# Patient Record
Sex: Female | Born: 2000 | Race: White | Hispanic: Yes | Marital: Single | Smoking: Never smoker
Health system: Southern US, Community
[De-identification: ages and names within clinical notes are randomized; demographics above are authoritative.]

## PROBLEM LIST (undated history)

## (undated) ENCOUNTER — Inpatient Hospital Stay (HOSPITAL_COMMUNITY): Payer: Self-pay

## (undated) DIAGNOSIS — E282 Polycystic ovarian syndrome: Secondary | ICD-10-CM

## (undated) DIAGNOSIS — R768 Other specified abnormal immunological findings in serum: Secondary | ICD-10-CM

## (undated) DIAGNOSIS — R7689 Other specified abnormal immunological findings in serum: Secondary | ICD-10-CM

## (undated) DIAGNOSIS — R569 Unspecified convulsions: Secondary | ICD-10-CM

## (undated) HISTORY — DX: Polycystic ovarian syndrome: E28.2

## (undated) HISTORY — PX: NO PAST SURGERIES: SHX2092

## (undated) HISTORY — DX: Other specified abnormal immunological findings in serum: R76.8

## (undated) HISTORY — DX: Unspecified convulsions: R56.9

## (undated) HISTORY — DX: Other specified abnormal immunological findings in serum: R76.89

---
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
## 2022-04-12 ENCOUNTER — Encounter (HOSPITAL_COMMUNITY): Payer: Self-pay

## 2022-04-12 ENCOUNTER — Ambulatory Visit (HOSPITAL_COMMUNITY)
Admission: EM | Admit: 2022-04-12 | Discharge: 2022-04-12 | Disposition: A | Discharge status: Home / Self Care | Payer: Medicaid Other | Attending: Family Medicine | Admitting: Family Medicine

## 2022-04-12 DIAGNOSIS — L600 Ingrowing nail: Secondary | ICD-10-CM | POA: Diagnosis not present

## 2022-04-12 DIAGNOSIS — M79675 Pain in left toe(s): Secondary | ICD-10-CM | POA: Diagnosis not present

## 2022-04-12 MED ORDER — IBUPROFEN 800 MG PO TABS: 800.0000 mg | ORAL_TABLET | Freq: Three times a day (TID) | ORAL | 0 refills | Status: DC

## 2022-04-12 MED ORDER — DOXYCYCLINE HYCLATE 100 MG PO CAPS: 100.0000 mg | ORAL_CAPSULE | Freq: Two times a day (BID) | ORAL | 0 refills | Status: DC

## 2022-04-12 NOTE — ED Provider Notes (Signed)
  [facility]   [id number] 04/12/22 Arrival Time: 1636  ASSESSMENT & PLAN:  1. Great toe pain, left   2. Ingrown toenail of left foot    Podiatry referral placed.  Discharge Medication List as of 04/12/2022  7:11 PM     START taking these medications   Details  doxycycline (VIBRAMYCIN) 100 MG capsule Take 1 capsule (100 mg total) by mouth 2 (two) times daily., Starting Thu 04/12/2022, Normal    ibuprofen (ADVIL) 800 MG tablet Take 1 tablet (800 mg total) by mouth 3 (three) times daily with meals., Starting Thu 04/12/2022, Normal         Follow-up Information     Schedule an appointment as soon as possible for a visit  with [facility]).   Contact information: [address] [place],  [place]  [address]  [phone]                Reviewed expectations re: course of current medical issues. Questions answered. Outlined signs and symptoms indicating need for more acute intervention. Understanding verbalized. After Visit Summary given.   SUBJECTIVE: History from: Patient. Andrea Jacobson is a 21 y.o. female. Reports: Pt presents to the office for left toe pain. Pt thinks she has ingrown toe nail. Is painful. No drainage or bleeding. Afebrile.  OBJECTIVE:  Vitals:   04/12/22 1902  BP: (!) 125/93  Pulse: 85  Resp: 18  Temp: 98.5 F (36.9 C)  TempSrc: Oral  SpO2: 99%    General appearance: alert; no distress Skin: redness/erythema and mild swelling of nailfold around lateral LEFT great toe; tender to touch Psychological: alert and cooperative; normal mood and affect   No Known Allergies  History reviewed. No pertinent past medical history. Social History   Socioeconomic History   Marital status: Single    Spouse name: Not on file   Number of children: Not on file   Years of education: Not on file   Highest education level: Not on file  Occupational History   Not on file  Tobacco Use   Smoking status: Never    Smokeless tobacco: Never  Substance and Sexual Activity   Alcohol use: Never   Drug use: Never   Sexual activity: Yes    Partners: Male    Birth control/protection: Condom, Pill  Other Topics Concern   Not on file  Social History Narrative   Mother of pt states pt lives with stepdad, stepmom and two babies   Social Determinants of Health   Financial Resource Strain: Not on file  Food Insecurity: Not on file  Transportation Needs: Not on file  Physical Activity: Not on file  Stress: Not on file  Social Connections: Not on file  Intimate Partner Violence: Not on file   History reviewed. No pertinent family history. History reviewed. No pertinent surgical history.   Mardella Layman, MD 04/12/22 [phone]

## 2022-04-12 NOTE — ED Triage Notes (Signed)
Pt presents to the office for left toe pain. Pt thinks she has ingrown toe nail.

## 2022-04-24 ENCOUNTER — Encounter (HOSPITAL_COMMUNITY): Payer: Self-pay

## 2022-04-24 ENCOUNTER — Emergency Department (HOSPITAL_COMMUNITY): Payer: Medicaid Other

## 2022-04-24 ENCOUNTER — Ambulatory Visit (INDEPENDENT_AMBULATORY_CARE_PROVIDER_SITE_OTHER): Payer: Medicaid Other | Admitting: Podiatry

## 2022-04-24 ENCOUNTER — Emergency Department (HOSPITAL_COMMUNITY)
Admission: EM | Admit: 2022-04-24 | Discharge: 2022-04-24 | Disposition: A | Discharge status: Home / Self Care | Payer: Medicaid Other | Attending: Emergency Medicine | Admitting: Emergency Medicine

## 2022-04-24 DIAGNOSIS — L600 Ingrowing nail: Secondary | ICD-10-CM | POA: Diagnosis not present

## 2022-04-24 DIAGNOSIS — R55 Syncope and collapse: Secondary | ICD-10-CM | POA: Diagnosis not present

## 2022-04-24 DIAGNOSIS — R42 Dizziness and giddiness: Secondary | ICD-10-CM | POA: Diagnosis present

## 2022-04-24 DIAGNOSIS — I959 Hypotension, unspecified: Secondary | ICD-10-CM | POA: Diagnosis not present

## 2022-04-24 LAB — URINALYSIS, ROUTINE W REFLEX MICROSCOPIC
Bilirubin Urine: NEGATIVE
Glucose, UA: NEGATIVE mg/dL
Hgb urine dipstick: NEGATIVE
Ketones, ur: NEGATIVE mg/dL
Nitrite: NEGATIVE
Protein, ur: NEGATIVE mg/dL
Specific Gravity, Urine: 1.024 (ref 1.005–1.030)
pH: 7 (ref 5.0–8.0)

## 2022-04-24 LAB — I-STAT BETA HCG BLOOD, ED (MC, WL, AP ONLY): I-stat hCG, quantitative: <5 m[IU]/mL (ref <5)

## 2022-04-24 LAB — CBC
HCT: 40 % (ref 36.0–46.0)
Hemoglobin: 13.1 g/dL (ref 12.0–15.0)
MCH: 28.5 pg (ref 26.0–34.0)
MCHC: 32.8 g/dL (ref 30.0–36.0)
MCV: 87.1 fL (ref 80.0–100.0)
Platelets: 214 10*3/uL (ref 150–400)
RBC: 4.59 MIL/uL (ref 3.87–5.11)
RDW: 12.7 % (ref 11.5–15.5)
WBC: 11 10*3/uL — ABNORMAL HIGH (ref 4.0–10.5)
nRBC: 0 % (ref 0.0–0.2)

## 2022-04-24 LAB — BASIC METABOLIC PANEL
Anion gap: 6 (ref 5–15)
BUN: 17 mg/dL (ref 6–20)
CO2: 21 mmol/L — ABNORMAL LOW (ref 22–32)
Calcium: 8.1 mg/dL — ABNORMAL LOW (ref 8.9–10.3)
Chloride: 112 mmol/L — ABNORMAL HIGH (ref 98–111)
Creatinine, Ser: 0.61 mg/dL (ref 0.44–1.00)
GFR, Estimated: >60 mL/min (ref >60)
Glucose, Bld: 83 mg/dL (ref 70–99)
Potassium: 2.9 mmol/L — ABNORMAL LOW (ref 3.5–5.1)
Sodium: 139 mmol/L (ref 135–145)

## 2022-04-24 LAB — CBG MONITORING, ED: Glucose-Capillary: 101 mg/dL — ABNORMAL HIGH (ref 70–99)

## 2022-04-24 MED ORDER — POTASSIUM CHLORIDE CRYS ER 20 MEQ PO TBCR: 60.0000 meq | EXTENDED_RELEASE_TABLET | Freq: Once | ORAL | Status: DC

## 2022-04-24 MED ORDER — SODIUM CHLORIDE 0.9 % IV BOLUS: 500.0000 mL | Freq: Once | INTRAVENOUS | Status: DC

## 2022-04-24 MED ORDER — CEFADROXIL 500 MG PO CAPS: 500.0000 mg | ORAL_CAPSULE | Freq: Two times a day (BID) | ORAL | 0 refills | Status: DC

## 2022-04-24 MED ORDER — NEOMYCIN-POLYMYXIN-HC 3.5-10000-1 OT SUSP: OTIC | 0 refills | Status: DC

## 2022-04-24 NOTE — ED Triage Notes (Signed)
Pt had a ingrown toenail procedure then passed out x 2. 110/66-60-100% RA CBG 149  # 18 L ac with 500 ns

## 2022-04-24 NOTE — Discharge Instructions (Addendum)
We evaluated you for your fainting.  Your lab tests and EKG were reassuring.  Your symptoms are likely due to a combination of dehydration and your toenail procedure.  Please drink a lot of fluids today and try to stay hydrated.  Please follow-up closely with your primary physician.  Please return to the emergency department if you develop any episodes without feeling dizzy beforehand, chest pain or shortness of breath, sudden onset of symptoms, severe headaches, abdominal pain, palpitations, or any other concerning symptoms.

## 2022-04-24 NOTE — Patient Instructions (Signed)

## 2022-04-24 NOTE — ED Provider Notes (Signed)
[facility] DEPT Provider Note  CSN: [id number] Arrival date & time: 04/24/22 1045  Chief Complaint(s) Near Syncope  HPI Andrea Jacobson is a 21 y.o. female without significant past medical history presenting to the emergency department with syncope.  Patient reports that she was having a toenail removed today, after the procedure she stood up to walk and felt dizzy, lightheaded, nauseous, and then lost consciousness.  She reports that she did not hit her head in the fall or have any pain since the fall.  She sat down at the clinic and it happened 1 more time so they advised her to come to the emergency department.  Patient reports she currently feels fine.  She denies any chest pain, palpitations, shortness of breath, nausea, vomiting, abdominal pain.  She denies any headaches.  Denies vision changes or neck stiffness.  She reports she has had nothing to eat or drink at all today.  Past Medical History History reviewed. No pertinent past medical history. 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 Authorizing Provider  cefadroxil (DURICEF) 500 MG capsule Take 1 capsule (500 mg total) by mouth 2 (two) times daily. 04/24/22   McDonald, Stephan Minister, DPM  doxycycline (VIBRAMYCIN) 100 MG capsule Take 1 capsule (100 mg total) by mouth 2 (two) times daily. 04/12/22   Vanessa Kick, MD  ibuprofen (ADVIL) 800 MG tablet Take 1 tablet (800 mg total) by mouth 3 (three) times daily with meals. 04/12/22   Vanessa Kick, MD  lidocaine (XYLOCAINE) 2 % jelly Apply 1 Application topically 2 (two) times daily. 11/03/21    Henson, Vickie L, NP-C  metroNIDAZOLE (FLAGYL) 500 MG tablet Take 1 tablet (500 mg total) by mouth 2 (two) times daily. 11/10/21   Henson, Vickie L, NP-C  neomycin-polymyxin-hydrocortisone (CORTISPORIN) 3.5-10000-1 OTIC suspension Apply 1-2 drops daily after soaking and cover with bandaid 04/24/22   McDonald, Stephan Minister, DPM  valACYclovir (VALTREX) 500 MG tablet TAKE 1 TABLET(500 MG) BY MOUTH EVERY 12 HOURS AT ONSET OF SYMPTOMS FOR 3 DAYS 12/14/21   Horald Pollen, MD                                                                                                                                    Past Surgical History History reviewed. No pertinent surgical history. Family History History reviewed. No pertinent family history.  Social History Social History   Tobacco Use   Smoking status: Never   Smokeless tobacco: Never  Substance Use Topics   Alcohol use: Never   Drug use: Never   Allergies Patient  has no known allergies.  Review of Systems Review of Systems  All other systems reviewed and are negative.   Physical Exam Vital Signs  I have reviewed the triage vital signs BP 105/68   Pulse 87   Temp 98.5 F (36.9 C) (Oral)   Resp 17   LMP 02/22/2022 (Approximate)   SpO2 94%  Physical Exam Vitals and nursing note reviewed.  Constitutional:      General: She is not in acute distress.    Appearance: She is well-developed.  HENT:     Head: Normocephalic and atraumatic.     Mouth/Throat:     Mouth: Mucous membranes are moist.  Eyes:     Pupils: Pupils are equal, round, and reactive to light.  Cardiovascular:     Rate and Rhythm: Normal rate and regular rhythm.     Heart sounds: No murmur heard. Pulmonary:     Effort: Pulmonary effort is normal. No respiratory distress.     Breath sounds: Normal breath sounds.  Abdominal:     General: Abdomen is flat.     Palpations: Abdomen is soft.     Tenderness: There is no abdominal tenderness.  Musculoskeletal:         General: No tenderness.     Right lower leg: No edema.     Left lower leg: No edema.  Skin:    General: Skin is warm and dry.  Neurological:     General: No focal deficit present.     Mental Status: She is alert. Mental status is at baseline.  Psychiatric:        Mood and Affect: Mood normal.        Behavior: Behavior normal.     ED Results and Treatments Labs (all labs ordered are listed, but only abnormal results are displayed) Labs Reviewed  BASIC METABOLIC PANEL - Abnormal; Notable for the following components:      Result Value   Potassium 2.9 (*)    Chloride 112 (*)    CO2 21 (*)    Calcium 8.1 (*)    All other components within normal limits  CBC - Abnormal; Notable for the following components:   WBC 11.0 (*)    All other components within normal limits  URINALYSIS, ROUTINE W REFLEX MICROSCOPIC - Abnormal; Notable for the following components:   APPearance HAZY (*)    Leukocytes,Ua TRACE (*)    Bacteria, UA FEW (*)    All other components within normal limits  CBG MONITORING, ED - Abnormal; Notable for the following components:   Glucose-Capillary 101 (*)    All other components within normal limits  CBG MONITORING, ED  I-STAT BETA HCG BLOOD, ED ([facility], [facility], [facility] ONLY)                                                                                                                          Radiology DG Chest 2 View  Result Date: 04/24/2022 CLINICAL DATA:  Provided  history: Syncope. EXAM: CHEST - 2 VIEW COMPARISON:  Prior chest radiographs 10/11/2006 and earlier. FINDINGS: Heart size within normal limits. No appreciable airspace consolidation. No evidence of pleural effusion or pneumothorax. No acute bony abnormality identified. 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 IMPRESSION: No evidence of active cardiopulmonary disease. 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 Electronically Signed   By: Jackey Loge D.O.   On: 04/24/2022 12:59    Pertinent  labs & imaging results that were available during my care of the patient were reviewed by me and considered in my medical decision making (see MDM for details).  Medications Ordered in ED Medications  sodium chloride 0.9 % bolus 500 mL (has no administration in time range)  potassium chloride SA (KLOR-CON M) CR tablet 60 mEq (has no administration in time range)                                                                                                                                     Procedures .1-3 Lead EKG Interpretation  Performed by: Lonell Grandchild, MD Authorized by: Lonell Grandchild, MD     Interpretation: normal     ECG rate:  86   ECG rate assessment: normal     Rhythm: sinus rhythm     Ectopy: none     Conduction: normal     (including critical care time)  Medical Decision Making / ED Course   MDM:  21 year old female resenting to the emergency department with syncope.  Suspect combination of vasovagal syncope and dehydration/orthostatic syncope.  Patient well-appearing.  Vital signs are reassuring and her exam is unremarkable.  Doubt cardiogenic cause of syncope, no evidence for proarrhythmic condition such as WPW, HOC UM, ARVD, Brugada, long QT syndrome.  She does have low potassium which is probably not related but we will replete. Doubt PE, PERC rule negative. Doubt neurogenic cause of syncope, no witnessed seizure activity or postictal period.  Patient able to stand in the room without recurrence of symptoms.  Patient also with negative pregnancy test, No abdominal pain to suggest ectopic pregnancy.  Given reassuring workup, will discharge with close primary physician follow-up. Will discharge patient to home. All questions answered. Patient comfortable with plan of discharge. Return precautions discussed with patient and specified on the after visit summary.    Additional history obtained: -Additional history obtained from family -External records  from outside source obtained and reviewed including: Chart review including previous notes, labs, imaging, consultation notes including UC visit 04/12/22   Lab Tests: -I ordered, reviewed, and interpreted labs.   The pertinent results include:   Labs Reviewed  BASIC METABOLIC PANEL - Abnormal; Notable for the following components:      Result Value   Potassium 2.9 (*)    Chloride 112 (*)    CO2 21 (*)    Calcium 8.1 (*)    All other components within normal limits  CBC -  Abnormal; Notable for the following components:   WBC 11.0 (*)    All other components within normal limits  URINALYSIS, ROUTINE W REFLEX MICROSCOPIC - Abnormal; Notable for the following components:   APPearance HAZY (*)    Leukocytes,Ua TRACE (*)    Bacteria, UA FEW (*)    All other components within normal limits  CBG MONITORING, ED - Abnormal; Notable for the following components:   Glucose-Capillary 101 (*)    All other components within normal limits  CBG MONITORING, ED  I-STAT BETA HCG BLOOD, ED ([facility], [facility], [facility] ONLY)    Notable for negative pregnancy, mild hypokalemia, mild leukocytosis likely reactive, UA with bacturia without symptoms of UTI  EKG   EKG Interpretation  Date/Time:  Tuesday April 24 2022 11:08:10 EST Ventricular Rate:  85 PR Interval:  125 QRS Duration: 90 QT Interval:  353 QTC Calculation: 420 R Axis:   75 Text Interpretation: Sinus rhythm Confirmed by Garnette Gunner [phone]) on 04/24/2022 2:32:26 PM         Imaging Studies ordered: I ordered imaging studies including CXR On my interpretation imaging demonstrates no acute process I independently visualized and interpreted imaging. I agree with the radiologist interpretation   Medicines ordered and prescription drug management: Meds ordered this encounter  Medications   sodium chloride 0.9 % bolus 500 mL   potassium chloride SA (KLOR-CON M) CR tablet 60 mEq    -I have reviewed the patients home medicines and have made  adjustments as needed   Cardiac Monitoring: The patient was maintained on a cardiac monitor.  I personally viewed and interpreted the cardiac monitored which showed an underlying rhythm of: NSR  Reevaluation: After the interventions noted above, I reevaluated the patient and found that they have improved  Co morbidities that complicate the patient evaluation History reviewed. No pertinent past medical history.    Dispostion: Disposition decision including need for hospitalization was considered, and patient discharged from emergency department.    Final Clinical Impression(s) / ED Diagnoses Final diagnoses:  Syncope and collapse     This chart was dictated using voice recognition software.  Despite best efforts to proofread,  errors can occur which can change the documentation meaning.    Cristie Hem, MD 04/24/22 1537

## 2022-04-24 NOTE — ED Provider Triage Note (Signed)
Emergency Medicine Provider Triage Evaluation Note  Andrea Jacobson , a 21 y.o. female  was evaluated in triage.  Syncopal event occurred around 9 AM this morning.  Patient was at her podiatrist office having an ingrown toenail removed after she got up from the procedure she began to feel nauseous.  She reports that while walking to the checkout counter she felt lightheaded and passed out briefly she reports the registration person was able to catch her before she fell to the ground.  She denies any injury.  Review of Systems  Positive: Lightheadedness, syncope, nausea Negative: Chest pain, shortness of breath, extremity swelling/color change, abdominal pain, injury  Physical Exam  BP 101/66 (BP Location: Right Arm)   Pulse 69   Temp 98.7 F (37.1 C) (Oral)   Resp (!) 23   SpO2 100%  Gen:   Awake, no distress   Resp:  Normal effort  MSK:   Moves extremities without difficulty  Other:  Heart regular rate and rhythm, lungs clear  Medical Decision Making  Medically screening exam initiated at 11:38 AM.  Appropriate orders placed.  Andrea Jacobson was informed that the remainder of the evaluation will be completed by another provider, this initial triage assessment does not replace that evaluation, and the importance of remaining in the ED until their evaluation is complete.     Note: Portions of this report may have been transcribed using voice recognition software. Every effort was made to ensure accuracy; however, inadvertent computerized transcription errors may still be present.    Andrea Salinas, PA-C 04/24/22 1139

## 2022-04-25 NOTE — Progress Notes (Signed)
  Subjective:  Patient ID: Andrea Jacobson, female    DOB: 2000-10-22,  MRN: [id number]  Chief Complaint  Patient presents with   Ingrown Toenail    Left great toe    21 y.o. female presents with the above complaint. History confirmed with patient.   Objective:  Physical Exam: warm, good capillary refill, no trophic changes or ulcerative lesions, normal DP and PT pulses, normal sensory exam, and ingrown left hallux medial border. Assessment:   1. Ingrown left greater toenail [L60.0]      Plan:  Patient was evaluated and treated and all questions answered.    Ingrown Nail, left -Patient elects to proceed with minor surgery to remove ingrown toenail today. Consent reviewed and signed by patient. -Ingrown nail excised. See procedure note. -Educated on post-procedure care including soaking. Written instructions provided and reviewed. -Rx for Cortisporin sent to pharmacy. -Advised on signs and symptoms of infection developing.  We discussed that the phenol likely will create some redness and edema and tenderness around the nailbed as long as it is localized this is to be expected.  Will return as needed if any infection signs develop  Procedure: Excision of Ingrown Toenail Location: Left 1st toe medial nail borders. Anesthesia: Lidocaine 1% plain; 1.5 mL and Marcaine 0.5% plain; 1.5 mL, digital block. Skin Prep: Betadine. Dressing: Silvadene; telfa; dry, sterile, compression dressing. Technique: Following skin prep, the toe was exsanguinated and a tourniquet was secured at the base of the toe. The affected nail border was freed, split with a nail splitter, and excised. Chemical matrixectomy was then performed with phenol and irrigated out with alcohol. The tourniquet was then removed and sterile dressing applied. Disposition: Patient tolerated procedure well.    Following the procedure the patient comfortably ambulated to the checkout area and then began to experience a syncopal  vasovagal type response.  She was seated and given water and crackers and was found to be hypotensive.  After letting her rest for some time she stood up this happened again and she was even further hypotensive and slightly tachycardic.  Oxygen saturation remained normal throughout.  She was brought back into the exam room and placed in the Trendelenburg position and EMS was called to transport her to the emergency room   No follow-ups on file.

## 2022-05-15 ENCOUNTER — Ambulatory Visit (INDEPENDENT_AMBULATORY_CARE_PROVIDER_SITE_OTHER): Payer: Medicaid Other | Admitting: Internal Medicine

## 2022-05-15 ENCOUNTER — Encounter: Payer: Self-pay | Admitting: Internal Medicine

## 2022-05-15 VITALS — BP 120/70 | HR 64 | Ht 61.0 in | Wt 154.0 lb

## 2022-05-15 DIAGNOSIS — L680 Hirsutism: Secondary | ICD-10-CM | POA: Diagnosis not present

## 2022-05-15 DIAGNOSIS — N911 Secondary amenorrhea: Secondary | ICD-10-CM | POA: Diagnosis not present

## 2022-05-15 DIAGNOSIS — E282 Polycystic ovarian syndrome: Secondary | ICD-10-CM | POA: Diagnosis not present

## 2022-05-15 LAB — COMPREHENSIVE METABOLIC PANEL
ALT: 47 U/L — ABNORMAL HIGH (ref 0–35)
AST: 27 U/L (ref 0–37)
Albumin: 4.6 g/dL (ref 3.5–5.2)
Alkaline Phosphatase: 84 U/L (ref 47–119)
BUN: 15 mg/dL (ref 6–23)
CO2: 26 mEq/L (ref 19–32)
Calcium: 9.7 mg/dL (ref 8.4–10.5)
Chloride: 105 mEq/L (ref 96–112)
Creatinine, Ser: 0.71 mg/dL (ref 0.40–1.20)
GFR: 123.4 mL/min (ref >60.00)
Glucose, Bld: 104 mg/dL — ABNORMAL HIGH (ref 70–99)
Potassium: 3.9 mEq/L (ref 3.5–5.1)
Sodium: 141 mEq/L (ref 135–145)
Total Bilirubin: 0.4 mg/dL (ref 0.2–1.2)
Total Protein: 7.7 g/dL (ref 6.0–8.3)

## 2022-05-15 LAB — HEMOGLOBIN A1C: Hgb A1c MFr Bld: 5.4 % (ref 4.6–6.5)

## 2022-05-15 LAB — LIPID PANEL
Cholesterol: 170 mg/dL (ref 0–200)
HDL: 52.7 mg/dL (ref >39.00)
LDL Cholesterol: 99 mg/dL (ref 0–99)
NonHDL: 117.3
Total CHOL/HDL Ratio: 3
Triglycerides: 91 mg/dL (ref 0.0–149.0)
VLDL: 18.2 mg/dL (ref 0.0–40.0)

## 2022-05-15 LAB — LUTEINIZING HORMONE: LH: 24 m[IU]/mL

## 2022-05-15 LAB — FOLLICLE STIMULATING HORMONE: FSH: 7.4 m[IU]/mL

## 2022-05-15 LAB — TSH: TSH: 1.12 u[IU]/mL (ref 0.40–5.00)

## 2022-05-15 LAB — CORTISOL: Cortisol, Plasma: 8.6 ug/dL

## 2022-05-15 LAB — T4, FREE: Free T4: 0.79 ng/dL (ref 0.60–1.60)

## 2022-05-15 NOTE — Progress Notes (Unsigned)
Name: Andrea Jacobson  MRN/ DOB: [id number], 10/17/2000    Age/ Sex: 22 y.o., female    PCP: Horald Pollen, MD   Reason for Endocrinology Evaluation: PCOS     Date of Initial Endocrinology Evaluation: 05/15/2022     HPI: Andrea Jacobson is a 22 y.o. female with a past medical history of PCOS. The patient presented for initial endocrinology clinic visit on 05/15/2022 for consultative assistance with her PCOS.   Patient presented to PCP in February 2023 for evaluation of hirsutism and concerns regarding PCOS  Historically she has had normal testosterone level at 26.5 NG/DL in February 2023, these results were confirmed again in March 2023.  She has had normal A1c at 5.4% 06/2021 and normal TFTs  Patient presented to the ED on 04/24/2022 with near syncope, she was noted with low potassium of 2.9 mmol/L, low calcium at 8.1 mg/dL    The patient has menstrual irregularities such as oligomenorrhea or amenorrhea. She started menarche around age 27 y.o.   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 She does not  have family history of PCOS, or, ovarian cancer.    No plans of conception  Last pelvic exam in the past few months    She was on OCP's from 2019 until 2022. She did not like the effects of it.    She did not recall last LMP but believes it may have been in 01/2022     HISTORY:  Past Medical History: No past medical history on file. Past Surgical History: No past surgical history on file.  Social History:  reports that she has never smoked. She has never used smokeless tobacco. She reports that she does not drink alcohol and does not use drugs. Family  History: family history is not on file.   HOME MEDICATIONS: Allergies as of 05/15/2022   No Known Allergies      Medication List        Accurate as of May 15, 2022  7:20 AM. If you have any questions, ask your nurse or doctor.          cefadroxil 500 MG capsule Commonly known as: DURICEF Take 1 capsule (500 mg total) by mouth 2 (two) times daily.   doxycycline 100 MG capsule Commonly known as: VIBRAMYCIN Take 1 capsule (100 mg total) by mouth 2 (two) times daily.   ibuprofen 800 MG tablet Commonly known as: ADVIL Take 1 tablet (800 mg total) by mouth 3 (three) times daily with meals.   lidocaine 2 % jelly Commonly known as: XYLOCAINE Apply 1 Application topically 2 (two) times daily.   metroNIDAZOLE 500 MG tablet Commonly known as: FLAGYL Take 1 tablet (500 mg total) by mouth 2 (two) times daily.   neomycin-polymyxin-hydrocortisone 3.5-10000-1 OTIC suspension Commonly known as: CORTISPORIN Apply 1-2 drops daily after soaking and cover with bandaid   valACYclovir 500 MG tablet Commonly known as: VALTREX TAKE 1 TABLET(500 MG) BY MOUTH EVERY 12 HOURS AT ONSET OF SYMPTOMS FOR 3 DAYS          REVIEW OF SYSTEMS: A comprehensive ROS was conducted with the patient and is negative except as per HPI  OBJECTIVE:  VS: BP 120/70 (BP Location: Left Arm, Patient Position: Sitting, Cuff Size: Small)   Pulse 64   Ht 5\' 1"  (1.549 m)   Wt 154 lb (69.9 kg)   LMP 02/22/2022 (Approximate)   SpO2 99%   BMI 29.10 kg/m    Wt Readings from Last 3 Encounters:  11/03/21 144 lb (65.3 kg) (77 %, Z= 0.75)*  07/03/21 144 lb 4 oz (65.4 kg) (79 %, Z= 0.79)*  11/24/20 147 lb 14.9 oz (67.1 kg) (83 %, Z= 0.96)*   * Growth percentiles are based on CDC (Girls, 2-20 Years) data.     EXAM: General: Pt appears well and is in NAD Stubbles noted at the side of face and chin areas  Eyes: External eye exam normal without stare, lid lag or exophthalmos.  EOM intact.  PERRL.  Neck:  General: Supple without adenopathy. Thyroid: Thyroid size normal.  No goiter or nodules appreciated. No thyroid bruit.  Lungs: Clear with good BS bilat with no rales, rhonchi, or wheezes  Heart: Auscultation: RRR.  Abdomen: Normoactive bowel sounds, soft, nontender, without masses or organomegaly palpable  Extremities:  BL LE: No pretibial edema normal ROM and strength.  Mental Status: Judgment, insight: Intact Orientation: Oriented to time, place, and person Mood and affect: No depression, anxiety, or agitation     DATA REVIEWED:     Latest Reference Range & Units 05/15/22 13:18  Sodium 135 - 145 mEq/L 141  Potassium 3.5 - 5.1 mEq/L 3.9  Chloride 96 - 112 mEq/L 105  CO2 19 - 32 mEq/L 26  Glucose 70 - 99 mg/dL 07/14/22 (H)  BUN 6 - 23 mg/dL 15  Creatinine 176 - 1.60 mg/dL 7.37  Calcium 8.4 - 1.06 mg/dL 9.7  Alkaline Phosphatase 47 - 119 U/L 84  Albumin 3.5 - 5.2 g/dL 4.6  AST 0 - 37 U/L 27  ALT 0 - 35 U/L 47 (H)  Total Protein 6.0 - 8.3 g/dL 7.7  Total Bilirubin 0.2 - 1.2 mg/dL 0.4  GFR 26.9 mL/min 123.40  Total CHOL/HDL Ratio  3  Cholesterol 0 - 200 mg/dL >48.54  HDL Cholesterol 627 mg/dL >03.50  LDL (calc) 0 - 99 mg/dL 99  NonHDL  09.38  Triglycerides 0.0 - 149.0 mg/dL 182.99  VLDL 0.0 - 37.1 mg/dL 69.6    Latest Reference Range & Units 05/15/22 13:18  DHEA-SO4 44 - 286 mcg/dL 07/14/22  Cortisol, Plasma ug/dL 8.6  LH mIU/mL 381  FSH mIU/ML 7.4  Prolactin ng/mL 7.0  Glucose 70 - 99 mg/dL 01.75 (H)  Hemoglobin 102 4.6 - 6.5 % 5.4  Estradiol pg/mL 32  TSH 0.40 - 5.00 uIU/mL 1.12  T4,Free(Direct) 0.60 - 1.60 ng/dL H8N     Pelvic ultrasound 07/05/2021 Narrative & Impression  CLINICAL DATA:  Evaluate ovaries, history of PCOS.  NO TRANSVAGINAL   EXAM: TRANSABDOMINAL ULTRASOUND OF PELVIS   TECHNIQUE: Transabdominal ultrasound examination of the pelvis was performed including evaluation of the uterus, ovaries, adnexal regions, and pelvic cul-de-sac.   COMPARISON:   None.   FINDINGS: Uterus   Measurements: 7.2 x 4.3 x 5.7 cm = volume: 94 mL. No fibroids or other mass visualized.   Endometrium   Thickness: 9 mm.  No focal abnormality visualized.   Right ovary   Not visualized transabdominally secondary to shadowing bowel gas.   Left ovary   Not visualized transabdominally secondary to shadowing bowel gas.   Other findings:  No abnormal free fluid.   IMPRESSION: Ovaries are not visualized  transabdominally secondary to shadowing bowel gas.      ASSESSMENT/PLAN/RECOMMENDATIONS:   Secondary Amenorrhea :  -High suspicion for polycystic ovary syndrome - She would like to avoid COC's due to skepticism about side effects  - She is interested in progesterone use  -We discussed increased risk of uterine cancer with unopposed estrogen effect -Labs show normal TFTs, DHEA-S and A1c as well as lipid  Medications : Start progesterone 200 mg, 2 tabs on days 1 through 10 of each month  2. Hirsutism   -Patient is not interested in combined COC's which is the first-line of treatment for hirsutism -Unable to proceed with spironolactone as long as she is not on COC's due to teratogenicity -DHEA-S and cortisol are normal -ACTH and testosterone pending, as well as 17 hydroxyprogesterone   Follow-up in 6 months Signed electronically by: Lyndle Herrlich, MD  [facility] End[facility]  [facility] [address] [place] [place], [place] [address] Phone: [phone] FAX: [phone]   CC: Georgina Quint, MD [address] [place] [address] Phone: [phone] Fax: [phone]   Return to Endocrinology clinic as below: Future Appointments  Date Time Provider Department Center  05/15/2022  1:00 PM Epifanio Labrador, Konrad Dolores, MD [facility] None

## 2022-05-15 NOTE — Patient Instructions (Signed)
      SALIVARY CORTISOL COLLECTION INSTRUCTIONS    Precautions:  Please collect sample at Midnight . You will need to do this on 2 nights  No food or fluids 30 minutes prior to collection.  Do not use any creams, lotions on hands, or use steroid inhalers 24- hours prior to collection.  Wash hands carefully.  Avoid any activity that could cause your gums to bleed: including flushing of brushing your teeth.  Kit must not be used in children less than 3 years of age, or a person that is at risk for choking on collection kit.  Instructions for saliva collection:   Rinse mouth thoroughly with water and discard. Do not swallow.  Hold the Salivette at the rim of the suspended insert and remove the stopper.  Remove the swab.  Place swab under tongue until well saturated, approximately 1 minute.   Return the saturated swab to the suspended insert and close the Salivette firmly with the stopper.  Do not remove the tube holding the insert. The Salivette should be sent to the lab with the swab.   Come to the lab and leave the Salivette kit for labeling with your identifying information.   Make sure you refrigerate sample if not bringing to the lab immediately. Try to use cold packs for transportation if available.     

## 2022-05-17 ENCOUNTER — Other Ambulatory Visit: Payer: Medicaid Other

## 2022-05-17 DIAGNOSIS — L680 Hirsutism: Secondary | ICD-10-CM | POA: Diagnosis not present

## 2022-05-17 MED ORDER — PROGESTERONE 200 MG PO CAPS: 400.0000 mg | ORAL_CAPSULE | ORAL | 6 refills | Status: DC

## 2022-05-19 LAB — TESTOSTERONE, TOTAL, LC/MS/MS: Testosterone, Total, LC-MS-MS: 37 ng/dL (ref 2–45)

## 2022-05-19 LAB — ESTRADIOL: Estradiol: 32 pg/mL

## 2022-05-19 LAB — ACTH: C206 ACTH: 12 pg/mL (ref 6–50)

## 2022-05-19 LAB — 17-HYDROXYPROGESTERONE: 17-OH-Progesterone, LC/MS/MS: 42 ng/dL

## 2022-05-19 LAB — PROLACTIN: Prolactin: 7 ng/mL

## 2022-05-19 LAB — DHEA-SULFATE: DHEA-SO4: 223 ug/dL (ref 44–286)

## 2022-05-25 LAB — SALIVARY CORTISOL X2, TIMED
Salivary Cortisol 2nd Specimen: <0.01 ug/dL
Salivary Cortisol Baseline: 0.016 ug/dL

## 2022-08-13 ENCOUNTER — Telehealth: Payer: Self-pay | Admitting: Emergency Medicine

## 2022-08-13 NOTE — Telephone Encounter (Signed)
Prescription Request  08/13/2022  LOV: Visit date not found  What is the name of the medication or equipment?  valACYclovir (VALTREX) 1000 MG tablet  Pt starting to experience out breaks again  Have you contacted your pharmacy to request a refill? No   Which pharmacy would you like this sent to?  [id number] [place], [place] - [address] AT [facility] [address] & [facility] [address] [place] [address] Phone: [phone] Fax: [phone]    Patient notified that their request is being sent to the clinical staff for review and that they should receive a response within 2 business days.   Please advise at Mobile [phone] (mobile)

## 2022-08-13 NOTE — Telephone Encounter (Signed)
Pt need to make appt for evaluation. Haven't seen MD since 06/2021. Pls schedule appt.Marland KitchenRaechel Chute

## 2022-08-14 NOTE — Telephone Encounter (Signed)
Scheduled patient for OV on 08/21/22.

## 2022-08-21 ENCOUNTER — Ambulatory Visit (INDEPENDENT_AMBULATORY_CARE_PROVIDER_SITE_OTHER): Payer: Medicaid Other | Admitting: Emergency Medicine

## 2022-08-21 ENCOUNTER — Encounter: Payer: Self-pay | Admitting: Emergency Medicine

## 2022-08-21 VITALS — BP 110/72 | HR 63 | Temp 98.6°F | Ht 61.0 in | Wt 153.4 lb

## 2022-08-21 DIAGNOSIS — E282 Polycystic ovarian syndrome: Secondary | ICD-10-CM

## 2022-08-21 DIAGNOSIS — R55 Syncope and collapse: Secondary | ICD-10-CM | POA: Diagnosis not present

## 2022-08-21 DIAGNOSIS — N911 Secondary amenorrhea: Secondary | ICD-10-CM

## 2022-08-21 HISTORY — DX: Secondary amenorrhea: N91.1

## 2022-08-21 NOTE — Assessment & Plan Note (Signed)
Creating secondary amenorrhea and hirsutism Clinically stable. No treatment at present time

## 2022-08-21 NOTE — Assessment & Plan Note (Signed)
Secondary to PCOS. Recently evaluated by endocrinologist and started on progesterone 400 mg 10 days out of the month

## 2022-08-21 NOTE — Assessment & Plan Note (Signed)
Not a recurrent problem. Completely negative workup last December.  Clinically stable.  No red flag signs or symptoms. No concerns.  No additional workup necessary at present time.

## 2022-08-21 NOTE — Progress Notes (Signed)
Andrea Jacobson 22 y.o.   Chief Complaint  Patient presents with   Loss of Consciousness    Patient states she has been having some syncope spells. She states she passed out 2-3 months ago, no syncope spells recently. Patient has been very lightheaded     HISTORY OF PRESENT ILLNESS: This is a 22 y.o. female here for follow-up of syncopal episode, last 1 last December when she was seen in the emergency department.  Doing well since then. Also has history of polycystic ovary syndrome.  Recently seen by endocrinologist. Recent blood work results reviewed with patient.  All normal. Emergency department office visit note from last December as follows: Medical Decision Making / ED Course     MDM:   22 year old female resenting to the emergency department with syncope.   Suspect combination of vasovagal syncope and dehydration/orthostatic syncope.  Patient well-appearing.  Vital signs are reassuring and her exam is unremarkable.  Doubt cardiogenic cause of syncope, no evidence for proarrhythmic condition such as WPW, HOC UM, ARVD, Brugada, long QT syndrome.  She does have low potassium which is probably not related but we will replete. Doubt PE, PERC rule negative. Doubt neurogenic cause of syncope, no witnessed seizure activity or postictal period.  Patient able to stand in the room without recurrence of symptoms.  Patient also with negative pregnancy test, No abdominal pain to suggest ectopic pregnancy.  Given reassuring workup, will discharge with close primary physician follow-up. Will discharge patient to home. All questions answered. Patient comfortable with plan of discharge. Return precautions discussed with patient and specified on the after visit summary.  Endocrinologist office visit note from last January as follows: ASSESSMENT/PLAN/RECOMMENDATIONS:    Secondary Amenorrhea :   -High suspicion for polycystic ovary syndrome - She would like to avoid COC's due to skepticism about side  effects  - She is interested in progesterone use  -We discussed increased risk of uterine cancer with unopposed estrogen effect -Labs show normal TFTs, DHEA-S and A1c as well as lipid   Medications : Start progesterone 200 mg, 2 tabs on days 1 through 10 of each month   2. Hirsutism    -Patient is not interested in combined COC's which is the first-line of treatment for hirsutism -Unable to proceed with spironolactone as long as she is not on COC's due to teratogenicity -DHEA-S and cortisol are normal -ACTH and testosterone pending, as well as 17 hydroxyprogesterone     Follow-up in 6 months Signed electronically by: Lyndle Herrlich, MD   [facility] Endocrinology  [facility] [address] [place], [place] [address] Phone: [phone] FAX: [phone]  Loss of Consciousness Pertinent negatives include no abdominal pain, chest pain, dizziness, fever, headaches, nausea, palpitations or vomiting.     Prior to Admission medications   Medication Sig Start Date End Date Taking? Authorizing Provider  progesterone (PROMETRIUM) 200 MG capsule Take 2 capsules (400 mg total) by mouth as directed. 2 tabs daily , for 10 days out of the month 05/17/22  Yes Shamleffer, Konrad Dolores, MD    No Known Allergies  Patient Active Problem List   Diagnosis Date Noted   Irregular periods 11/03/2021   PCOS (polycystic ovarian syndrome) 07/03/2021    No past medical history on file.  No past surgical history on file.  Social History   Socioeconomic History   Marital status: Single    Spouse name: Not on file   Number of children: Not on file   Years  of education: Not on file   Highest education level: Not on file  Occupational History   Not on file  Tobacco Use   Smoking status: Never   Smokeless tobacco: Never  Substance and Sexual Activity   Alcohol use: Never   Drug use: Never   Sexual activity: Yes    Partners: Male    Birth  control/protection: Condom, Pill  Other Topics Concern   Not on file  Social History Narrative   Mother of pt states pt lives with stepdad, stepmom and two babies   Social Determinants of Health   Financial Resource Strain: Not on file  Food Insecurity: Not on file  Transportation Needs: Not on file  Physical Activity: Not on file  Stress: Not on file  Social Connections: Not on file  Intimate Partner Violence: Not on file    No family history on file.   Review of Systems  Constitutional: Negative.  Negative for chills and fever.  HENT: Negative.  Negative for congestion and sore throat.   Respiratory: Negative.  Negative for cough and shortness of breath.   Cardiovascular:  Positive for syncope. Negative for chest pain and palpitations.  Gastrointestinal:  Negative for abdominal pain, diarrhea, nausea and vomiting.  Genitourinary: Negative.  Negative for dysuria and hematuria.  Skin: Negative.  Negative for rash.  Neurological:  Negative for dizziness and headaches.  All other systems reviewed and are negative.   Vitals:   08/21/22 1053  BP: 110/72  Pulse: 63  Temp: 98.6 F (37 C)  SpO2: 97%    Physical Exam Vitals reviewed.  Constitutional:      Appearance: Normal appearance.  HENT:     Head: Normocephalic.     Mouth/Throat:     Mouth: Mucous membranes are moist.     Pharynx: Oropharynx is clear.  Eyes:     Extraocular Movements: Extraocular movements intact.     Conjunctiva/sclera: Conjunctivae normal.     Pupils: Pupils are equal, round, and reactive to light.  Cardiovascular:     Rate and Rhythm: Normal rate and regular rhythm.     Pulses: Normal pulses.     Heart sounds: Normal heart sounds.  Pulmonary:     Effort: Pulmonary effort is normal.     Breath sounds: Normal breath sounds.  Musculoskeletal:     Cervical back: No tenderness.  Lymphadenopathy:     Cervical: No cervical adenopathy.  Skin:    General: Skin is warm and dry.     Capillary  Refill: Capillary refill takes less than 2 seconds.  Neurological:     General: No focal deficit present.     Mental Status: She is alert and oriented to person, place, and time.  Psychiatric:        Mood and Affect: Mood normal.        Behavior: Behavior normal.      ASSESSMENT & PLAN: A total of 42 minutes was spent with the patient and counseling/coordination of care regarding preparing for this visit, review of most recent office visit notes, review of most recent endocrinologist office visit note, review of most recent emergency department visit notes, review of most recent blood work results, differential diagnosis of syncope, education on nutrition, review of all medications, prognosis, documentation, and need for follow-up.  Problem List Items Addressed This Visit       Endocrine   PCOS (polycystic ovarian syndrome)    Creating secondary amenorrhea and hirsutism Clinically stable. No treatment at present time  Other   Vasovagal syncope - Primary    Not a recurrent problem. Completely negative workup last December.  Clinically stable.  No red flag signs or symptoms. No concerns.  No additional workup necessary at present time.      Secondary amenorrhea    Secondary to PCOS. Recently evaluated by endocrinologist and started on progesterone 400 mg 10 days out of the month      Patient Instructions  Health Maintenance, Female Adopting a healthy lifestyle and getting preventive care are important in promoting health and wellness. Ask your health care provider about: The right schedule for you to have regular tests and exams. Things you can do on your own to prevent diseases and keep yourself healthy. What should I know about diet, weight, and exercise? Eat a healthy diet  Eat a diet that includes plenty of vegetables, fruits, low-fat dairy products, and lean protein. Do not eat a lot of foods that are high in solid fats, added sugars, or sodium. Maintain a  healthy weight Body mass index (BMI) is used to identify weight problems. It estimates body fat based on height and weight. Your health care provider can help determine your BMI and help you achieve or maintain a healthy weight. Get regular exercise Get regular exercise. This is one of the most important things you can do for your health. Most adults should: Exercise for at least 150 minutes each week. The exercise should increase your heart rate and make you sweat (moderate-intensity exercise). Do strengthening exercises at least twice a week. This is in addition to the moderate-intensity exercise. Spend less time sitting. Even light physical activity can be beneficial. Watch cholesterol and blood lipids Have your blood tested for lipids and cholesterol at 20 years of age, then have this test every 5 years. Have your cholesterol levels checked more often if: Your lipid or cholesterol levels are high. You are older than 20 years of age. You are at high risk for heart disease. What should I know about cancer screening? Depending on your health history and family history, you may need to have cancer screening at various ages. This may include screening for: Breast cancer. Cervical cancer. Colorectal cancer. Skin cancer. Lung cancer. What should I know about heart disease, diabetes, and high blood pressure? Blood pressure and heart disease High blood pressure causes heart disease and increases the risk of stroke. This is more likely to develop in people who have high blood pressure readings or are overweight. Have your blood pressure checked: Every 3-5 years if you are 88-20 years of age. Every year if you are 35 years old or older. Diabetes Have regular diabetes screenings. This checks your fasting blood sugar level. Have the screening done: Once every three years after age 84 if you are at a normal weight and have a low risk for diabetes. More often and at a younger age if you are  overweight or have a high risk for diabetes. What should I know about preventing infection? Hepatitis B If you have a higher risk for hepatitis B, you should be screened for this virus. Talk with your health care provider to find out if you are at risk for hepatitis B infection. Hepatitis C Testing is recommended for: Everyone born from [birth year] through 1963. Anyone with known risk factors for hepatitis C. Sexually transmitted infections (STIs) Get screened for STIs, including gonorrhea and chlamydia, if: You are sexually active and are younger than 20 years of age. You are older than 20  years of age and your health care provider tells you that you are at risk for this type of infection. Your sexual activity has changed since you were last screened, and you are at increased risk for chlamydia or gonorrhea. Ask your health care provider if you are at risk. Ask your health care provider about whether you are at high risk for HIV. Your health care provider may recommend a prescription medicine to help prevent HIV infection. If you choose to take medicine to prevent HIV, you should first get tested for HIV. You should then be tested every 3 months for as long as you are taking the medicine. Pregnancy If you are about to stop having your period (premenopausal) and you may become pregnant, seek counseling before you get pregnant. Take 400 to 800 micrograms (mcg) of folic acid every day if you become pregnant. Ask for birth control (contraception) if you want to prevent pregnancy. Osteoporosis and menopause Osteoporosis is a disease in which the bones lose minerals and strength with aging. This can result in bone fractures. If you are 18 years old or older, or if you are at risk for osteoporosis and fractures, ask your health care provider if you should: Be screened for bone loss. Take a calcium or vitamin D supplement to lower your risk of fractures. Be given hormone replacement therapy (HRT) to treat  symptoms of menopause. Follow these instructions at home: Alcohol use Do not drink alcohol if: Your health care provider tells you not to drink. You are pregnant, may be pregnant, or are planning to become pregnant. If you drink alcohol: Limit how much you have to: 0-1 drink a day. Know how much alcohol is in your drink. In the [place] one drink equals one 12 oz bottle of beer (355 mL), one 5 oz glass of wine (148 mL), or one 1 oz glass of hard liquor (44 mL). Lifestyle Do not use any products that contain nicotine or tobacco. These products include cigarettes, chewing tobacco, and vaping devices, such as e-cigarettes. If you need help quitting, ask your health care provider. Do not use street drugs. Do not share needles. Ask your health care provider for help if you need support or information about quitting drugs. General instructions Schedule regular health, dental, and eye exams. Stay current with your vaccines. Tell your health care provider if: You often feel depressed. You have ever been abused or do not feel safe at home. Summary Adopting a healthy lifestyle and getting preventive care are important in promoting health and wellness. Follow your health care provider's instructions about healthy diet, exercising, and getting tested or screened for diseases. Follow your health care provider's instructions on monitoring your cholesterol and blood pressure. This information is not intended to replace advice given to you by your health care provider. Make sure you discuss any questions you have with your health care provider. Document Revised: 09/12/2020 Document Reviewed: 09/12/2020 Elsevier Patient Education  2023 Elsevier Inc.     Edwina Barth, MD [facility]

## 2022-08-21 NOTE — Patient Instructions (Signed)

## 2022-09-14 ENCOUNTER — Ambulatory Visit (INDEPENDENT_AMBULATORY_CARE_PROVIDER_SITE_OTHER): Payer: Medicaid Other

## 2022-09-14 DIAGNOSIS — Z111 Encounter for screening for respiratory tuberculosis: Secondary | ICD-10-CM

## 2022-09-17 ENCOUNTER — Ambulatory Visit: Payer: Medicaid Other

## 2022-09-17 LAB — TB SKIN TEST
Induration: 0 mm
TB Skin Test: NEGATIVE

## 2022-09-17 NOTE — Progress Notes (Signed)
  PPD read and results entered in EpicCare. Result: 0 mm induration. Interpretation: Pt came to TB to be read, no induration or erythema.

## 2022-11-13 ENCOUNTER — Encounter: Payer: Self-pay | Admitting: Internal Medicine

## 2022-11-13 ENCOUNTER — Ambulatory Visit (INDEPENDENT_AMBULATORY_CARE_PROVIDER_SITE_OTHER): Payer: Medicaid Other | Admitting: Internal Medicine

## 2022-11-13 VITALS — BP 120/75 | HR 69 | Ht 61.0 in | Wt 160.2 lb

## 2022-11-13 DIAGNOSIS — R635 Abnormal weight gain: Secondary | ICD-10-CM

## 2022-11-13 DIAGNOSIS — L680 Hirsutism: Secondary | ICD-10-CM | POA: Diagnosis not present

## 2022-11-13 DIAGNOSIS — E282 Polycystic ovarian syndrome: Secondary | ICD-10-CM

## 2022-11-13 LAB — COMPREHENSIVE METABOLIC PANEL
ALT: 32 U/L (ref 0–35)
AST: 23 U/L (ref 0–37)
Albumin: 4.4 g/dL (ref 3.5–5.2)
Alkaline Phosphatase: 79 U/L (ref 47–119)
BUN: 13 mg/dL (ref 6–23)
CO2: 28 mEq/L (ref 19–32)
Calcium: 9.8 mg/dL (ref 8.4–10.5)
Chloride: 105 mEq/L (ref 96–112)
Creatinine, Ser: 0.78 mg/dL (ref 0.40–1.20)
GFR: 109.85 mL/min (ref >60.00)
Glucose, Bld: 89 mg/dL (ref 70–99)
Potassium: 4 mEq/L (ref 3.5–5.1)
Sodium: 138 mEq/L (ref 135–145)
Total Bilirubin: 0.6 mg/dL (ref 0.2–1.2)
Total Protein: 7.5 g/dL (ref 6.0–8.3)

## 2022-11-13 LAB — LIPID PANEL
Cholesterol: 167 mg/dL (ref 0–200)
HDL: 48.5 mg/dL (ref >39.00)
LDL Cholesterol: 105 mg/dL — ABNORMAL HIGH (ref 0–99)
NonHDL: 118.06
Total CHOL/HDL Ratio: 3
Triglycerides: 66 mg/dL (ref 0.0–149.0)
VLDL: 13.2 mg/dL (ref 0.0–40.0)

## 2022-11-13 LAB — HEMOGLOBIN A1C: Hgb A1c MFr Bld: 5.4 % (ref 4.6–6.5)

## 2022-11-13 NOTE — Progress Notes (Unsigned)
Name: Andrea Jacobson  MRN/ DOB: [id number], 11-Sep-2000    Age/ Sex: 22 y.o., female    PCP: Georgina Quint, MD   Reason for Endocrinology Evaluation: PCOS     Date of Initial Endocrinology Evaluation: 05/15/2022    HPI: Ms. Andrea Jacobson is a 22 y.o. female with a past medical history of PCOS. The patient presented for initial endocrinology clinic visit on 05/15/2022 for consultative assistance with her PCOS.   Patient presented to PCP in February 2023 for evaluation of hirsutism and concerns regarding PCOS  Historically she has had normal testosterone level at 26.5 NG/DL in February 2023, these results were confirmed again in March 2023.  She has had normal A1c at 5.4% 06/2021 and normal TFTs  Patient presented to the ED on 04/24/2022 with near syncope, she was noted with low potassium of 2.9 mmol/L, low calcium at 8.1 mg/dL    The patient has menstrual irregularities such as oligomenorrhea or amenorrhea. She started menarche around age 73 y.o.   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 She does not  have family history of PCOS, or, ovarian cancer.     She was on OCP's from 2019 until 2022. She did not like the effects of it.   Her labs 05/2022 showed A1c 5.4%, normal DHEA-S 223 mcg/DL, normal 17 hydroxyprogesterone 42 NG/DL, normal estradiol, FSH, LH, cortisol and ACTH, as well as normal testosterone 37 NG/DL, normal TFTs and prolactin   Saliva cortisol x 2 normal   Patient declined COC's due to skepticism about side effects, and opted for monthly progesterone  SUBJECTIVE:    Today (11/13/22):  Andrea Jacobson is here for follow-up on  PCOS.  Patient has been noted with weight gain She has been having menstruations  Hirsutism is stable  She exercises occasionally  She does snack  Denies constipation or diarrhea       Progesterone 200 mg, 2 tabs on days 1 through 10 of each month    HISTORY:  Past Medical History: No past medical history on file. Past Surgical History: No past surgical history on file.  Social History:  reports that she has never smoked. She has never used smokeless tobacco. She reports that she does not drink alcohol and does not use drugs. Family History: family history is not on file.   HOME MEDICATIONS: Allergies as of 11/13/2022   No Known Allergies      Medication List        Accurate as of November 13, 2022 11:54 AM. If you have any questions, ask your nurse or doctor.          progesterone 200 MG capsule Commonly known as: PROMETRIUM Take 2 capsules (400 mg total) by mouth as directed. 2 tabs daily , for 10 days out of the month          REVIEW OF SYSTEMS: A comprehensive ROS was conducted with the patient and is negative except as per HPI    OBJECTIVE:  VS: There were no vitals taken for this visit.   Wt Readings from Last 3 Encounters:  08/21/22 153 lb 6 oz (69.6 kg) (83 %, Z= 0.97)*  05/15/22 154 lb (  69.9 kg) (84 %, Z= 1.01)*  11/03/21 144 lb (65.3 kg) (77 %, Z= 0.75)*   * Growth percentiles are based on CDC (Girls, 2-20 Years) data.     EXAM: General: Pt appears well and is in NAD Stubbles noted at the side of face and chin areas  Eyes: External eye exam normal without stare, lid lag or exophthalmos.  EOM intact.  PERRL.  Neck: General: Supple without adenopathy. Thyroid: Thyroid size normal.  No goiter or nodules appreciated. No thyroid bruit.  Lungs: Clear with good BS bilat with no rales, rhonchi, or wheezes  Heart: Auscultation: RRR.  Abdomen: Normoactive bowel sounds, soft, nontender, without masses or organomegaly palpable  Extremities:  BL LE: No  pretibial edema normal ROM and strength.  Mental Status: Judgment, insight: Intact Orientation: Oriented to time, place, and person Mood and affect: No depression, anxiety, or agitation     DATA REVIEWED:     Latest Reference Range & Units 05/15/22 13:18  Sodium 135 - 145 mEq/L 141  Potassium 3.5 - 5.1 mEq/L 3.9  Chloride 96 - 112 mEq/L 105  CO2 19 - 32 mEq/L 26  Glucose 70 - 99 mg/dL 161 (H)  BUN 6 - 23 mg/dL 15  Creatinine 0.96 - 0.45 mg/dL 4.09  Calcium 8.4 - 81.1 mg/dL 9.7  Alkaline Phosphatase 47 - 119 U/L 84  Albumin 3.5 - 5.2 g/dL 4.6  AST 0 - 37 U/L 27  ALT 0 - 35 U/L 47 (H)  Total Protein 6.0 - 8.3 g/dL 7.7  Total Bilirubin 0.2 - 1.2 mg/dL 0.4  GFR >91.47 mL/min 123.40  Total CHOL/HDL Ratio  3  Cholesterol 0 - 200 mg/dL 829  HDL Cholesterol >56.21 mg/dL 30.86  LDL (calc) 0 - 99 mg/dL 99  NonHDL  578.46  Triglycerides 0.0 - 149.0 mg/dL 96.2  VLDL 0.0 - 95.2 mg/dL 84.1    Latest Reference Range & Units 05/15/22 13:18  DHEA-SO4 44 - 286 mcg/dL 324  Cortisol, Plasma ug/dL 8.6  LH mIU/mL 40.10  FSH mIU/ML 7.4  Prolactin ng/mL 7.0  Glucose 70 - 99 mg/dL 272 (H)  Hemoglobin Z3G 4.6 - 6.5 % 5.4  Estradiol pg/mL 32  TSH 0.40 - 5.00 uIU/mL 1.12  T4,Free(Direct) 0.60 - 1.60 ng/dL 6.44     Pelvic ultrasound 07/05/2021 Narrative & Impression  CLINICAL DATA:  Evaluate ovaries, history of PCOS.  NO TRANSVAGINAL   EXAM: TRANSABDOMINAL ULTRASOUND OF PELVIS   TECHNIQUE: Transabdominal ultrasound examination of the pelvis was performed including evaluation of the uterus, ovaries, adnexal regions, and pelvic cul-de-sac.   COMPARISON:  None.   FINDINGS: Uterus   Measurements: 7.2 x 4.3 x 5.7 cm = volume: 94 mL. No fibroids or other mass visualized.   Endometrium   Thickness: 9 mm.  No focal abnormality visualized.   Right ovary   Not visualized transabdominally secondary to shadowing bowel gas.   Left ovary   Not visualized transabdominally secondary to  shadowing bowel gas.   Other findings:  No abnormal free fluid.   IMPRESSION: Ovaries are not visualized transabdominally secondary to shadowing bowel gas.      ASSESSMENT/PLAN/RECOMMENDATIONS:   Secondary Amenorrhea :  -High suspicion for polycystic ovary syndrome - She would like to avoid COC's due to skepticism about side effects  - She is interested in progesterone use  -We discussed increased risk of uterine cancer with unopposed estrogen effect -Labs show normal TFTs, DHEA-S and A1c as well as lipid  Medications : Start progesterone 200  mg, 2 tabs on days 1 through 10 of each month  2. Hirsutism   -Patient is not interested in combined COC's which is the first-line of treatment for hirsutism -Unable to proceed with spironolactone as long as she is not on COC's due to teratogenicity -DHEA-S and cortisol are normal -ACTH and testosterone pending, as well as 17 hydroxyprogesterone   Follow-up in 6 months Signed electronically by: Lyndle Herrlich, MD  [facility] Endocrin[facility]  [facility] [address] [place], [place] [address] Phone: [phone] FAX: [phone]   CC: Georgina Quint, MD [address] [place] [address] Phone: [phone] Fax: [phone]   Return to Endocrinology clinic as below: No future appointments.

## 2022-11-14 DIAGNOSIS — L680 Hirsutism: Secondary | ICD-10-CM | POA: Insufficient documentation

## 2022-11-14 DIAGNOSIS — R635 Abnormal weight gain: Secondary | ICD-10-CM | POA: Insufficient documentation

## 2022-11-14 MED ORDER — PROGESTERONE 200 MG PO CAPS: 400.0000 mg | ORAL_CAPSULE | ORAL | 3 refills | Status: DC

## 2022-11-28 ENCOUNTER — Ambulatory Visit (HOSPITAL_COMMUNITY)
Admission: RE | Admit: 2022-11-28 | Discharge: 2022-11-28 | Disposition: A | Discharge status: Home / Self Care | Payer: Medicaid Other | Source: Ambulatory Visit | Attending: Internal Medicine | Admitting: Internal Medicine

## 2022-11-28 ENCOUNTER — Encounter (HOSPITAL_COMMUNITY): Payer: Self-pay

## 2022-11-28 VITALS — BP 121/81 | HR 82 | Temp 98.6°F | Resp 18

## 2022-11-28 DIAGNOSIS — Z3202 Encounter for pregnancy test, result negative: Secondary | ICD-10-CM

## 2022-11-28 LAB — POCT URINE PREGNANCY: Preg Test, Ur: NEGATIVE

## 2022-11-28 NOTE — ED Triage Notes (Signed)
Pt states her last normal MC was 6/12 and had 2 neg home pregnancy test. States yesterday started having light bleeding. States started progesterone 6 months ago for irregular [facility] and has been on time until now. Denies any other sx's.

## 2022-11-28 NOTE — ED Provider Notes (Signed)
[facility]    CSN: [id number] Arrival date & time: 11/28/22  1026      History   Chief Complaint Chief Complaint  Patient presents with   Possible Pregnancy    Entered by patient    HPI Andrea Jacobson is a 22 y.o. female.   HPI Patient here for a pregnancy test. She has a history of irregular periods and is currently prescribed progesterone 6 months ago to regulate her period. Her period has been lighter than normal and she was concerned for possible pregnancy despite having two negative home pregnancy test.  History reviewed. No pertinent past medical history.  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 No pertinent surgical history.  OB History     Gravida  0   Para  0   Term  0   Preterm  0   AB  0   Living  0      SAB  0   IAB  0   Ectopic  0   Multiple  0   Live Births  0            Home Medications    Prior to Admission medications   Medication Sig Start Date End Date Taking? Authorizing Provider  progesterone (PROMETRIUM) 200 MG capsule Take 2 capsules (400 mg total) by mouth as directed. 2 tabs daily , for 10 days out of the month 11/14/22   Shamleffer, Konrad Dolores, MD    Family History History reviewed. No pertinent family history.  Social History Social History   Tobacco Use   Smoking status: Never   Smokeless tobacco: Never  Substance Use Topics   Alcohol use: Never   Drug use: Never     Allergies   Patient has no known allergies.   Review of Systems Review of Systems Pertinent negatives listed in HPI   Physical Exam Triage Vital Signs ED Triage Vitals [11/28/22 1042]  Encounter Vitals Group     BP 121/81     Systolic BP Percentile      Diastolic BP Percentile       Pulse Rate 82     Resp 18     Temp 98.6 F (37 C)     Temp Source Oral     SpO2 98 %     Weight      Height      Head Circumference      Peak Flow      Pain Score 0     Pain Loc      Pain Education      Exclude from Growth Chart    No data found.  Updated Vital Signs BP 121/81 (BP Location: Right Arm)   Pulse 82   Temp 98.6 F (37 C) (Oral)   Resp 18   LMP 10/17/2022   SpO2 98%   Visual Acuity Right Eye Distance:   Left Eye Distance:   Bilateral Distance:    Right Eye Near:   Left Eye Near:    Bilateral Near:     Physical Exam General appearance: Alert, well developed, well nourished, cooperative Head: Normocephalic, without obvious abnormality, atraumatic Heart: Rate and Rhythm normal. No gallop or murmurs noted on exam  Respiratory: Respirations even and unlabored, normal respiratory rate  Extremities: No gross deformities Skin: Skin color, texture, turgor normal. No rashes seen  Psych: Appropriate mood and affect.  UC Treatments / Results  Labs (all labs ordered are listed, but only abnormal results are displayed) Labs Reviewed  POCT URINE PREGNANCY    EKG   Radiology No results found.  Procedures Procedures (including critical care time)  Medications Ordered in UC Medications - No data to display  Initial Impression / Assessment and Plan / UC Course  I have reviewed the triage vital signs and the nursing notes.  Pertinent labs & imaging results that were available during my care of the patient were reviewed by me and considered in my medical decision making (see chart for details).    Negative pregnancy test. Encouraged to take OCP the same time everyday to improve regulation of menstrual period. Follow-up with OBGYN as needed  Final Clinical Impressions(s) / UC Diagnoses   Final diagnoses:  Encounter for pregnancy test with result negative     Discharge Instructions      Your pregnancy test is negative.  You likely just have been a  lighter than normal as is expected when you are taking progesterone only oral contraceptive.     ED Prescriptions   None    PDMP not reviewed this encounter.   Bing Neighbors, NP 11/28/22 [phone]

## 2022-11-28 NOTE — Discharge Instructions (Addendum)
Your pregnancy test is negative.  You likely just have been a lighter than normal as is expected when you are taking progesterone only oral contraceptive.

## 2023-01-11 ENCOUNTER — Telehealth: Payer: Self-pay | Admitting: Emergency Medicine

## 2023-01-11 ENCOUNTER — Other Ambulatory Visit: Payer: Self-pay | Admitting: Internal Medicine

## 2023-01-11 NOTE — Telephone Encounter (Signed)
Patient would like to know if she can transfer from Dr. Alvy Bimler to [facility]. She said she would prefer to have a female provider. Best callback is [phone].

## 2023-01-14 ENCOUNTER — Other Ambulatory Visit (HOSPITAL_COMMUNITY)
Admission: RE | Admit: 2023-01-14 | Discharge: 2023-01-14 | Disposition: A | Discharge status: Home / Self Care | Payer: Medicaid Other | Source: Ambulatory Visit | Attending: Family Medicine | Admitting: Family Medicine

## 2023-01-14 ENCOUNTER — Encounter: Payer: Self-pay | Admitting: Family Medicine

## 2023-01-14 ENCOUNTER — Ambulatory Visit (INDEPENDENT_AMBULATORY_CARE_PROVIDER_SITE_OTHER): Payer: Medicaid Other | Admitting: Family Medicine

## 2023-01-14 VITALS — BP 110/64 | HR 66 | Temp 98.6°F | Resp 20 | Ht 61.0 in | Wt 152.0 lb

## 2023-01-14 DIAGNOSIS — N760 Acute vaginitis: Secondary | ICD-10-CM | POA: Diagnosis not present

## 2023-01-14 DIAGNOSIS — Z113 Encounter for screening for infections with a predominantly sexual mode of transmission: Secondary | ICD-10-CM | POA: Insufficient documentation

## 2023-01-14 DIAGNOSIS — N898 Other specified noninflammatory disorders of vagina: Secondary | ICD-10-CM | POA: Diagnosis not present

## 2023-01-14 DIAGNOSIS — Z23 Encounter for immunization: Secondary | ICD-10-CM

## 2023-01-14 DIAGNOSIS — B3731 Acute candidiasis of vulva and vagina: Secondary | ICD-10-CM

## 2023-01-14 DIAGNOSIS — B9689 Other specified bacterial agents as the cause of diseases classified elsewhere: Secondary | ICD-10-CM | POA: Diagnosis not present

## 2023-01-14 MED ORDER — MUPIROCIN 2 % EX OINT: TOPICAL_OINTMENT | CUTANEOUS | 3 refills | Status: DC

## 2023-01-14 NOTE — Progress Notes (Signed)
   Assessment & Plan:  1. Acute vulvovaginitis Education provided on vaginitis. - Cervicovaginal ancillary only - mupirocin ointment (BACTROBAN) 2 %; Apply to affected area TID for 7 days.  Dispense: 30 g; Refill: 3  2. Screening examination for STI - Cervicovaginal ancillary only  3. Need for immunization against influenza Influenza vaccine given in office today.   Follow up plan: Return if symptoms worsen or fail to improve.  Deliah Boston, MSN, APRN, FNP-C  Subjective:  HPI: Andrea Jacobson is a 21 y.o. female presenting on 01/14/2023 for Rash (Around vaginal area - itches /Noticed WED of last week /Slight brown discharge x few days /)  Patient reports itching around her vagina that started five days ago.  Denies any painful areas or burning when she urinates, but she does report it burns when she wipes.  She has a brown clumpy vaginal discharge that started a few days ago.  Denies any sores or open areas.  She does have a history of HSV.  She has been applying petroleum jelly to help.  Denies any new soap, laundry detergent, or other products applied to her genital area.   ROS: Negative unless specifically indicated above in HPI.   Relevant past medical history reviewed and updated as indicated.   Allergies and medications reviewed and updated.   Current Outpatient Medications:    progesterone (PROMETRIUM) 200 MG capsule, TAKE 2 CAPSULES BY MOUTH ONCE DAILY FOR 10 DAYS OUT OF THE MONTH, Disp: 60 capsule, Rfl: 3  No Known Allergies  Objective:   BP 110/64   Pulse 66   Temp 98.6 F (37 C)   Resp 20   Ht 5\' 1"  (1.549 m)   Wt 152 lb (68.9 kg)   LMP 11/29/2022 (Approximate)   BMI 28.72 kg/m    Physical Exam Vitals reviewed. Exam conducted with a chaperone present (J. Bullins).  Constitutional:      General: She is not in acute distress.    Appearance: Normal appearance. She is not ill-appearing, toxic-appearing or diaphoretic.  HENT:     Head: Normocephalic and  atraumatic.  Eyes:     General: No scleral icterus.       Right eye: No discharge.        Left eye: No discharge.     Conjunctiva/sclera: Conjunctivae normal.  Cardiovascular:     Rate and Rhythm: Normal rate.  Pulmonary:     Effort: Pulmonary effort is normal. No respiratory distress.  Genitourinary:    Exam position: Lithotomy position.     Pubic Area: No rash.      Comments: Labia minora are erythematous and irritated.  Musculoskeletal:        General: Normal range of motion.     Cervical back: Normal range of motion.  Skin:    General: Skin is warm and dry.     Capillary Refill: Capillary refill takes less than 2 seconds.  Neurological:     General: No focal deficit present.     Mental Status: She is alert and oriented to person, place, and time. Mental status is at baseline.  Psychiatric:        Mood and Affect: Mood normal.        Behavior: Behavior normal.        Thought Content: Thought content normal.        Judgment: Judgment normal.

## 2023-01-15 LAB — CERVICOVAGINAL ANCILLARY ONLY
Bacterial Vaginitis (gardnerella): POSITIVE — AB
Candida Glabrata: NEGATIVE
Candida Vaginitis: POSITIVE — AB
Chlamydia: NEGATIVE
Comment: NEGATIVE
Comment: NEGATIVE
Comment: NEGATIVE
Comment: NEGATIVE
Comment: NEGATIVE
Comment: NORMAL
Neisseria Gonorrhea: NEGATIVE
Trichomonas: NEGATIVE

## 2023-01-15 MED ORDER — FLUCONAZOLE 150 MG PO TABS
150.0000 mg | ORAL_TABLET | Freq: Once | ORAL | 0 refills | Status: AC
Start: 2023-01-15 — End: 2023-01-15

## 2023-01-15 MED ORDER — METRONIDAZOLE 500 MG PO TABS: 500.0000 mg | ORAL_TABLET | Freq: Two times a day (BID) | ORAL | 0 refills | Status: AC

## 2023-01-15 NOTE — Addendum Note (Signed)
Addended by: Gwenlyn Fudge on: 01/15/2023 03:39 PM   Modules accepted: Orders

## 2023-04-02 ENCOUNTER — Encounter: Payer: Self-pay | Admitting: Internal Medicine

## 2023-04-02 ENCOUNTER — Ambulatory Visit: Payer: Medicaid Other | Admitting: Family Medicine

## 2023-04-02 ENCOUNTER — Ambulatory Visit (INDEPENDENT_AMBULATORY_CARE_PROVIDER_SITE_OTHER): Payer: Medicaid Other | Admitting: Internal Medicine

## 2023-04-02 VITALS — BP 110/64 | HR 61 | Temp 98.2°F | Resp 16 | Ht 61.0 in | Wt 138.2 lb

## 2023-04-02 DIAGNOSIS — L2084 Intrinsic (allergic) eczema: Secondary | ICD-10-CM

## 2023-04-02 DIAGNOSIS — A6004 Herpesviral vulvovaginitis: Secondary | ICD-10-CM | POA: Diagnosis not present

## 2023-04-02 MED ORDER — HYDROXYZINE HCL 10 MG PO TABS
10.0000 mg | ORAL_TABLET | Freq: Three times a day (TID) | ORAL | 0 refills | Status: DC | PRN
Start: 2023-04-02 — End: 2023-05-24

## 2023-04-02 MED ORDER — FLUOCINONIDE 0.05 % EX CREA
1.0000 | TOPICAL_CREAM | Freq: Two times a day (BID) | CUTANEOUS | 0 refills | Status: DC
Start: 2023-04-02 — End: 2023-05-24

## 2023-04-02 MED ORDER — VALACYCLOVIR HCL 1 G PO TABS: 1000.0000 mg | ORAL_TABLET | Freq: Two times a day (BID) | ORAL | 0 refills | Status: AC

## 2023-04-02 MED ORDER — METHYLPREDNISOLONE 4 MG PO TBPK: ORAL_TABLET | ORAL | 0 refills | Status: DC

## 2023-04-02 NOTE — Patient Instructions (Signed)
Atopic Dermatitis ?Atopic dermatitis is a skin disorder that causes inflammation of the skin. It is marked by a red rash and itchy, dry, scaly skin. It is the most common type of eczema. Eczema is a group of skin conditions that cause the skin to become rough and swollen. This condition is generally worse during the cooler winter months and often improves during the warm summer months. ?Atopic dermatitis usually starts showing signs in infancy and can last through adulthood. This condition cannot be passed from one person to another (is not contagious). Atopic dermatitis may not always be present, but when it is, it is called a flare-up. ?What are the causes? ?The exact cause of this condition is not known. Flare-ups may be triggered by: ?Coming in contact with something that you are sensitive or allergic to (allergen). ?Stress. ?Certain foods. ?Extremely hot or cold weather. ?Harsh chemicals and soaps. ?Dry air. ?Chlorine. ?What increases the risk? ?This condition is more likely to develop in people who have a personal or family history of: ?Eczema. ?Allergies. ?Asthma. ?Hay fever. ?What are the signs or symptoms? ?Symptoms of this condition include: ?Dry, scaly skin. ?Red, itchy rash. ?Itchiness, which can be severe. This may occur before the skin rash. This can make sleeping difficult. ?Skin thickening and cracking that can occur over time. ?How is this diagnosed? ?This condition is diagnosed based on: ?Your symptoms. ?Your medical history. ?A physical exam. ?How is this treated? ?There is no cure for this condition, but symptoms can usually be controlled. Treatment focuses on: ?Controlling the itchiness and scratching. You may be given medicines, such as antihistamines or steroid creams. ?Limiting exposure to allergens. ?Recognizing situations that cause stress and developing a plan to manage stress. ?If your atopic dermatitis does not get better with medicines, or if it is all over your body (widespread), a  treatment using a specific type of light (phototherapy) may be used. ?Follow these instructions at home: ?Skin care ? ?Keep your skin well moisturized. Doing this seals in moisture and helps to prevent dryness. ?Use unscented lotions that have petroleum in them. ?Avoid lotions that contain alcohol or water. They can dry the skin. ?Keep baths or showers short (less than 5 minutes) in warm water. Do not use hot water. ?Use mild, unscented cleansers for bathing. Avoid soap and bubble bath. ?Apply a moisturizer to your skin right after a bath or shower. ?Do not apply anything to your skin without checking with your health care provider. ?General instructions ?Take or apply over-the-counter and prescription medicines only as told by your health care provider. ?Dress in clothes made of cotton or cotton blends. Dress lightly because heat increases itchiness. ?When washing your clothes, rinse your clothes twice so all of the soap is removed. ?Avoid any triggers that can cause a flare-up. ?Keep your fingernails cut short. ?Avoid scratching. Scratching makes the rash and itchiness worse. A break in the skin from scratching could result in a skin infection (impetigo). ?Do not be around people who have cold sores or fever blisters. If you get the infection, it may cause your atopic dermatitis to worsen. ?Keep all follow-up visits. This is important. ?Contact a health care provider if: ?Your itchiness interferes with sleep. ?Your rash gets worse or is not better within one week of starting treatment. ?You have a fever. ?You have a rash flare-up after having contact with someone who has cold sores or fever blisters. ?Get help right away if: ?You develop pus or soft yellow scabs in the rash  area. ?Summary ?Atopic dermatitis causes a red rash and itchy, dry, scaly skin. ?Treatment focuses on controlling the itchiness and scratching, limiting exposure to things that you are sensitive or allergic to (allergens), recognizing  situations that cause stress, and developing a plan to manage stress. ?Keep your skin well moisturized. ?Keep baths or showers shorter than 5 minutes and use warm water. Do not use hot water. ?This information is not intended to replace advice given to you by your health care provider. Make sure you discuss any questions you have with your health care provider. ?Document Revised: 02/01/2020 Document Reviewed: 02/01/2020 ?Elsevier Patient Education ? 2023 [employer] ? ?

## 2023-04-02 NOTE — Progress Notes (Unsigned)
Subjective:  Patient ID: Andrea Jacobson, female    DOB: 03-21-2001  Age: 22 y.o. MRN: [id number]  CC: Rash   HPI Andrea Jacobson presents for f/up ----  Discussed the use of AI scribe software for clinical note transcription with the patient, who gave verbal consent to proceed.  History of Present Illness   The patient presents with a chief complaint of an itchy, dry rash on the right arm, persisting for a little over a week. The rash initially appeared on the upper arm and subsequently spread downwards and to the side. The patient denies any pain associated with the rash.  In an attempt to manage the symptoms, the patient has been applying a fragrance-free lotion with a red top and white bottle, the name of which they could not recall. The lotion provided temporary relief for the dryness but did not alleviate the itchiness. The dryness would return after showering. The patient denies using any other topical treatments prior to the onset of the rash.       Outpatient Medications Prior to Visit  Medication Sig Dispense Refill   progesterone (PROMETRIUM) 200 MG capsule TAKE 2 CAPSULES BY MOUTH ONCE DAILY FOR 10 DAYS OUT OF THE MONTH 60 capsule 3   mupirocin ointment (BACTROBAN) 2 % Apply to affected area TID for 7 days. 30 g 3   No facility-administered medications prior to visit.    ROS Review of Systems  Constitutional: Negative.  Negative for chills, diaphoresis, fatigue and fever.  HENT: Negative.  Negative for sore throat.   Eyes: Negative.   Respiratory:  Negative for cough, chest tightness, shortness of breath and wheezing.   Cardiovascular:  Negative for chest pain, palpitations and leg swelling.  Gastrointestinal:  Negative for abdominal pain, diarrhea, nausea and vomiting.  Endocrine: Negative.   Genitourinary:  Positive for genital sores. Negative for decreased urine volume, difficulty urinating, urgency, vaginal bleeding, vaginal discharge and vaginal pain.        Painful vaginal blisters, +++ history of herpes  Musculoskeletal: Negative.  Negative for arthralgias.  Skin:  Positive for color change and rash.  Neurological: Negative.  Negative for dizziness and weakness.  Hematological:  Negative for adenopathy. Does not bruise/bleed easily.  Psychiatric/Behavioral: Negative.      Objective:  BP 110/64 (BP Location: Left Arm, Patient Position: Sitting, Cuff Size: Normal)   Pulse 61   Temp 98.2 F (36.8 C) (Oral)   Resp 16   Ht 5\' 1"  (1.549 m)   Wt 138 lb 3.2 oz (62.7 kg)   LMP 03/31/2023   SpO2 99%   BMI 26.11 kg/m   BP Readings from Last 3 Encounters:  04/02/23 110/64  01/14/23 110/64  11/28/22 121/81    Wt Readings from Last 3 Encounters:  04/02/23 138 lb 3.2 oz (62.7 kg)  01/14/23 152 lb (68.9 kg) (82%, Z= 0.90)*  11/13/22 160 lb 3.2 oz (72.7 kg) (87%, Z= 1.14)*   * Growth percentiles are based on CDC (Girls, 2-20 Years) data.    Physical Exam Vitals reviewed.  Constitutional:      Appearance: Normal appearance.  HENT:     Mouth/Throat:     Mouth: Mucous membranes are moist.  Eyes:     General: No scleral icterus.    Conjunctiva/sclera: Conjunctivae normal.  Cardiovascular:     Rate and Rhythm: Normal rate and regular rhythm.     Heart sounds: No murmur heard. Pulmonary:     Effort: Pulmonary effort is normal.  Breath sounds: No stridor. No wheezing, rhonchi or rales.  Abdominal:     General: Abdomen is flat.     Palpations: There is no mass.     Tenderness: There is no abdominal tenderness. There is no guarding.     Hernia: No hernia is present.  Musculoskeletal:     Cervical back: Neck supple.  Lymphadenopathy:     Cervical: No cervical adenopathy.  Skin:    Findings: Erythema and rash present.  Neurological:     General: No focal deficit present.     Mental Status: She is alert. Mental status is at baseline.     Lab Results  Component Value Date   WBC 11.0 (H) 04/24/2022   HGB 13.1 04/24/2022    HCT 40.0 04/24/2022   PLT 214 04/24/2022   GLUCOSE 89 11/13/2022   CHOL 167 11/13/2022   TRIG 66.0 11/13/2022   HDL 48.50 11/13/2022   LDLCALC 105 (H) 11/13/2022   ALT 32 11/13/2022   AST 23 11/13/2022   NA 138 11/13/2022   K 4.0 11/13/2022   CL 105 11/13/2022   CREATININE 0.78 11/13/2022   BUN 13 11/13/2022   CO2 28 11/13/2022   TSH 1.12 05/15/2022   HGBA1C 5.4 11/13/2022    No results found.  Assessment & Plan:  Intrinsic eczema -     Fluocinonide; Apply 1 Application topically 2 (two) times daily.  Dispense: 60 g; Refill: 0 -     hydrOXYzine HCl; Take 1 tablet (10 mg total) by mouth every 8 (eight) hours as needed for itching.  Dispense: 30 tablet; Refill: 0 -     methylPREDNISolone; TAKE AS DIRECTED  Dispense: 21 tablet; Refill: 0  Herpes simplex vulvovaginitis -     valACYclovir HCl; Take 1 tablet (1,000 mg total) by mouth 2 (two) times daily for 7 days.  Dispense: 14 tablet; Refill: 0     Follow-up: Return if symptoms worsen or fail to improve.  Sanda Linger, MD

## 2023-04-29 ENCOUNTER — Encounter (HOSPITAL_COMMUNITY): Payer: Self-pay

## 2023-04-29 ENCOUNTER — Ambulatory Visit (HOSPITAL_COMMUNITY)
Admission: EM | Admit: 2023-04-29 | Discharge: 2023-04-29 | Disposition: A | Discharge status: Home / Self Care | Payer: Medicaid Other | Attending: Emergency Medicine | Admitting: Emergency Medicine

## 2023-04-29 DIAGNOSIS — S0990XA Unspecified injury of head, initial encounter: Secondary | ICD-10-CM

## 2023-04-29 MED ORDER — ACETAMINOPHEN 325 MG PO TABS
975.0000 mg | ORAL_TABLET | Freq: Once | ORAL | Status: AC
  Administered 2023-04-29: 975 mg via ORAL

## 2023-04-29 MED ORDER — ACETAMINOPHEN 325 MG PO TABS
ORAL_TABLET | ORAL | Status: AC
  Filled 2023-04-29: qty 3
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
## 2023-04-29 NOTE — Discharge Instructions (Addendum)
You can continue alternating tylenol and ibuprofen if needed for headache Apply ice for 10-15 minutes to help with swelling You should notice improvement in the next 2-3 days  If pain becomes severe or does not respond to medicine, please go to the emergency department.
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
## 2023-05-16 NOTE — Progress Notes (Signed)
 Name: Andrea Jacobson  MRN/ DOB: [id number], May 28, 2000    Age/ Sex: 23 y.o., female    PCP: Purcell Emil Schanz, MD   Reason for Endocrinology Evaluation: PCOS     Date of Initial Endocrinology Evaluation: 05/15/2022    HPI: Ms. Andrea Jacobson is a 23 y.o. female with a past medical history of PCOS. The patient presented for initial endocrinology clinic visit on 05/15/2022 for consultative assistance with her PCOS.   Patient presented to PCP in February 2023 for evaluation of hirsutism and concerns regarding PCOS  Historically she has had normal testosterone  level at 26.5 NG/DL in February 2023, these results were confirmed again in March 2023.  She has had normal A1c at 5.4% 06/2021 and normal TFTs  Patient presented to the ED on 04/24/2022 with near syncope, she was noted with low potassium of 2.9 mmol/L, low calcium at 8.1 mg/dL    The patient has menstrual irregularities such as oligomenorrhea or amenorrhea. She started menarche around age 71 y.o.   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 She does not  have family history of PCOS, or, ovarian cancer.     She was on OCP's from 2019 until 2022. She did not like the effects of it.   Her labs 05/2022 showed A1c 5.4%, normal DHEA-S 223 mcg/DL, normal 17 hydroxyprogesterone 42 NG/DL, normal estradiol , FSH, LH, cortisol and ACTH , as well as normal testosterone  37 NG/DL, normal TFTs and prolactin   Saliva cortisol x 2 normal 05/2022   Patient declined COC's due to skepticism about side effects, and opted for monthly progesterone   SUBJECTIVE:    Today (05/17/23):  Cicley Jacobson is here for follow-up on  PCOS.  Patient has been noted with weight loss since her last visit here , she has been active working at [employer]  She has been having menstruations regularity Hirsutism is improving, doing full body laser hair removal  Denies constipation or diarrhea  She is sexually active , uses condoms  Denies local neck swelling  Denies tremors or palpitations    Progesterone  200 mg, 2 tabs on days 1 through 10 of each month    HISTORY:  Past Medical History:  Past Medical History:  Diagnosis Date   HSV-2 seropositive    Past Surgical History: No past surgical history on file.  Social History:  reports that she has never smoked. She has never used smokeless tobacco. She reports that she does not drink alcohol and does not use drugs. Family History: family history is not on file.   HOME MEDICATIONS: Allergies as of 05/17/2023   No Known Allergies      Medication List        Accurate as of May 17, 2023  1:37 PM. If you have any questions, ask your nurse or doctor.          fluocinonide  cream 0.05 % Commonly known as: LIDEX  Apply 1 Application topically 2 (two) times daily.   hydrOXYzine  10 MG tablet Commonly known as: ATARAX  Take 1 tablet (10 mg total) by mouth every 8 (eight) hours as needed for itching.   methylPREDNISolone  4 MG Tbpk tablet Commonly known as: MEDROL  DOSEPAK TAKE AS DIRECTED  progesterone  200 MG capsule Commonly known as: PROMETRIUM  Take 2 capsules (400 mg total) by mouth as directed. 2 caps 10 days a month What changed: See the new instructions.          REVIEW OF SYSTEMS: A comprehensive ROS was conducted with the patient and is negative except as per HPI    OBJECTIVE:  VS: BP 118/62   Pulse 66   Ht 5' 1 (1.549 m)   Wt 137 lb 12.8 oz (62.5 kg)   LMP 03/31/2023 (Exact Date)   SpO2 99%   BMI 26.04 kg/m    Wt Readings from Last 3 Encounters:  05/17/23 137 lb 12.8 oz (62.5 kg)  04/29/23 137 lb (62.1 kg)  04/02/23 138 lb 3.2 oz  (62.7 kg)     EXAM: General: Pt appears well and is in NAD  Neck: General: Supple without adenopathy. Thyroid : Thyroid  size normal.  No goiter or nodules appreciated.  Lungs: Clear with good BS bilat   Heart: Auscultation: RRR.  Abdomen: Soft, nontender  Extremities:  BL LE: No pretibial edema normal   Mental Status: Judgment, insight: Intact Orientation: Oriented to time, place, and person Mood and affect: No depression, anxiety, or agitation     DATA REVIEWED:   Latest Reference Range & Units 05/17/23 10:23  Sodium 135 - 146 mmol/L 139  Potassium 3.5 - 5.3 mmol/L 3.8  Chloride 98 - 110 mmol/L 105  CO2 20 - 32 mmol/L 26  Glucose 65 - 139 mg/dL 896  BUN 7 - 25 mg/dL 14  Creatinine 9.49 - 9.03 mg/dL 9.35  Calcium 8.6 - 89.7 mg/dL 9.5  BUN/Creatinine Ratio 6 - 22 (calc) SEE NOTE:  Total CHOL/HDL Ratio <5.0 (calc) 3.0  Cholesterol <200 mg/dL 872  HDL Cholesterol > OR = 50 mg/dL 43 (L)  LDL Cholesterol (Calc) mg/dL (calc) 67  Non-HDL Cholesterol (Calc) <130 mg/dL (calc) 84  Triglycerides <150 mg/dL 91  (L): Data is abnormally low    Pelvic ultrasound 07/05/2021 Narrative & Impression  CLINICAL DATA:  Evaluate ovaries, history of PCOS.  NO TRANSVAGINAL   EXAM: TRANSABDOMINAL ULTRASOUND OF PELVIS   TECHNIQUE: Transabdominal ultrasound examination of the pelvis was performed including evaluation of the uterus, ovaries, adnexal regions, and pelvic cul-de-sac.   COMPARISON:  None.   FINDINGS: Uterus   Measurements: 7.2 x 4.3 x 5.7 cm = volume: 94 mL. No fibroids or other mass visualized.   Endometrium   Thickness: 9 mm.  No focal abnormality visualized.   Right ovary   Not visualized transabdominally secondary to shadowing bowel gas.   Left ovary   Not visualized transabdominally secondary to shadowing bowel gas.   Other findings:  No abnormal free fluid.   IMPRESSION: Ovaries are not visualized transabdominally secondary to shadowing bowel gas.       ASSESSMENT/PLAN/RECOMMENDATIONS:   PCOS:  The management of PCOS involves amelioration of hyperandrogenic symptoms, metabolic abnormalities , prevention of endometrial hyperplasia and carcinoma as well as contraception for those not pursuing pregnancy.  - She would like to avoid COC's due to skepticism about side effects  -Saliva cortisol and 17 hydroxyprogesterone were normal 05/2022 -Patient is having regular menstruations with progesterone  intake -BMP normal  Medications :  progesterone  200 mg, 2 tabs on days 1 through 10 of each month  2. Hirsutism   -Patient is not interested in combined COC's which is the first-line of treatment for hirsutism -Unable to proceed with spironolactone as long as she is not on COC's due to  teratogenicity -DHEA-S , testosterone , 17 hydroxyprogesterone and salivary cortisol are normal -Undergoing laser hair removal at this time  3.Dyslipidemia :  -I have praised the patient on weight loss -Lipid panel today shows normalization of triglyceride and LDL -Will encourage exercise to improve HDL level    Follow-up in 1 yr     Signed electronically by: Stefano Redgie Butts, MD  [facility] Endocr[facility]  [facility] [address] [place] [place], [place] [address] Phone: [phone] FAX: [phone]   CC: Purcell Emil Schanz, MD [address] [place] [address] Phone: [phone] Fax: [phone]   Return to Endocrinology clinic as below: Future Appointments  Date Time Provider Department Center  05/18/2024  7:30 AM Runette Scifres, Donell Redgie, MD [facility] None

## 2023-05-17 ENCOUNTER — Ambulatory Visit (INDEPENDENT_AMBULATORY_CARE_PROVIDER_SITE_OTHER): Payer: Medicaid Other | Admitting: Internal Medicine

## 2023-05-17 VITALS — BP 118/62 | HR 66 | Ht 61.0 in | Wt 137.8 lb

## 2023-05-17 DIAGNOSIS — E282 Polycystic ovarian syndrome: Secondary | ICD-10-CM

## 2023-05-17 DIAGNOSIS — L680 Hirsutism: Secondary | ICD-10-CM | POA: Diagnosis not present

## 2023-05-17 DIAGNOSIS — E785 Hyperlipidemia, unspecified: Secondary | ICD-10-CM

## 2023-05-17 LAB — POCT GLYCOSYLATED HEMOGLOBIN (HGB A1C): Hemoglobin A1C: 5.1 % (ref 4.0–5.6)

## 2023-05-17 MED ORDER — PROGESTERONE 200 MG PO CAPS: 400.0000 mg | ORAL_CAPSULE | ORAL | 3 refills | Status: DC

## 2023-05-18 LAB — BASIC METABOLIC PANEL
BUN: 14 mg/dL (ref 7–25)
CO2: 26 mmol/L (ref 20–32)
Calcium: 9.5 mg/dL (ref 8.6–10.2)
Chloride: 105 mmol/L (ref 98–110)
Creat: 0.64 mg/dL (ref 0.50–0.96)
Glucose, Bld: 103 mg/dL (ref 65–139)
Potassium: 3.8 mmol/L (ref 3.5–5.3)
Sodium: 139 mmol/L (ref 135–146)

## 2023-05-18 LAB — LIPID PANEL
Cholesterol: 127 mg/dL (ref <200)
HDL: 43 mg/dL — ABNORMAL LOW (ref >=50)
LDL Cholesterol (Calc): 67 mg/dL
Non-HDL Cholesterol (Calc): 84 mg/dL (ref <130)
Total CHOL/HDL Ratio: 3 (calc) (ref <5.0)
Triglycerides: 91 mg/dL (ref <150)

## 2023-05-24 ENCOUNTER — Ambulatory Visit (INDEPENDENT_AMBULATORY_CARE_PROVIDER_SITE_OTHER): Payer: Medicaid Other | Admitting: Physician Assistant

## 2023-05-24 ENCOUNTER — Encounter: Payer: Self-pay | Admitting: Physician Assistant

## 2023-05-24 VITALS — BP 94/56 | HR 88 | Temp 98.0°F | Ht 61.0 in | Wt 134.6 lb

## 2023-05-24 DIAGNOSIS — R1084 Generalized abdominal pain: Secondary | ICD-10-CM | POA: Diagnosis not present

## 2023-05-24 DIAGNOSIS — N926 Irregular menstruation, unspecified: Secondary | ICD-10-CM | POA: Diagnosis not present

## 2023-05-24 DIAGNOSIS — R110 Nausea: Secondary | ICD-10-CM

## 2023-05-24 DIAGNOSIS — Z3491 Encounter for supervision of normal pregnancy, unspecified, first trimester: Secondary | ICD-10-CM | POA: Diagnosis not present

## 2023-05-24 LAB — POC URINALSYSI DIPSTICK (AUTOMATED)
Bilirubin, UA: NEGATIVE
Blood, UA: NEGATIVE
Glucose, UA: NEGATIVE — AB
Ketones, UA: POSITIVE
Leukocytes, UA: NEGATIVE
Nitrite, UA: NEGATIVE
Protein, UA: POSITIVE — AB
Spec Grav, UA: >=1.03 — AB (ref 1.010–1.025)
Urobilinogen, UA: 0.2 U/dL
pH, UA: 5.5 (ref 5.0–8.0)

## 2023-05-24 LAB — POCT URINE PREGNANCY: Preg Test, Ur: POSITIVE — AB

## 2023-05-24 MED ORDER — OMEPRAZOLE 20 MG PO CPDR: 20.0000 mg | DELAYED_RELEASE_CAPSULE | Freq: Two times a day (BID) | ORAL | 0 refills | Status: DC

## 2023-05-24 NOTE — Progress Notes (Unsigned)
    Patient ID: Andrea Jacobson, female    DOB: [birth year], 23 y.o.   MRN: [id number]   Assessment & Plan:  There are no diagnoses linked to this encounter.     First day of period was potentially Nov 20, but she doesn't track, isn't exactly sure.   No follow-ups on file.    Subjective:    Chief Complaint  Patient presents with   Cough   Abdominal Pain    Pt in office c/o cough and stomach pain for the past two weeks; can feel mucus in throat when coughing but not able to cough up anything, this causing nausea. 3 wks ago pt also states she fainted 2 times in one night as well. Pt states hx of syncope in April 2024; pt has irregular cycles and no cycle since November; sexually active and not using any type of contraceptives at this time    HPI Patient is in today for ***  Past Medical History:  Diagnosis Date   HSV-2 seropositive     History reviewed. No pertinent surgical history.  History reviewed. No pertinent family history.  Social History   Tobacco Use   Smoking status: Never   Smokeless tobacco: Never  Vaping Use   Vaping status: Never Used  Substance Use Topics   Alcohol use: Never   Drug use: Never     No Known Allergies  Review of Systems NEGATIVE UNLESS OTHERWISE INDICATED IN HPI      Objective:     Pulse 88   Temp 98 F (36.7 C) (Temporal)   Ht 5\' 1"  (1.549 m)   Wt 134 lb 9.6 oz (61.1 kg)   LMP 03/31/2023 (Exact Date)   SpO2 98%   BMI 25.43 kg/m   Wt Readings from Last 3 Encounters:  05/24/23 134 lb 9.6 oz (61.1 kg)  05/17/23 137 lb 12.8 oz (62.5 kg)  04/29/23 137 lb (62.1 kg)    BP Readings from Last 3 Encounters:  05/17/23 118/62  04/29/23 112/77  04/02/23 110/64     Physical Exam        Time Spent: *** minutes of total time was spent on the date of the encounter performing the following actions: chart review prior to seeing the patient, obtaining history, performing a medically necessary exam, counseling on the  treatment plan, placing orders, and documenting in our EHR.       Daymian Lill M Kerin Kren, PA-C

## 2023-05-24 NOTE — Patient Instructions (Addendum)
For nausea: Vitamin B6: You can take 10 to 25 milligrams (mg) every 6 to 8 hours. Unisom: You can take 12.5 mg to 25 mg once before bed. Start with B6 first, if not helping, then add Unisom.  Start on a Prenatal vitamin daily.  Drink plenty of fluids! Liquid IV, Propel, water, all helpful.  You need to eat breakfast!   Referral sent to [facility] PRE[facility] - if any sudden severe abdominal pain, vaginal bleeding, or uncontrolled vomiting.

## 2023-05-27 ENCOUNTER — Telehealth: Payer: Self-pay

## 2023-05-27 NOTE — Telephone Encounter (Signed)
Left messaage for patient to return call - work restrictions due to pregnancy will need to come from her OBGYN, not her PCP.  Copied from CRM [id number]. Topic: General - Other >> May 27, 2023  8:34 AM Kathryne Eriksson wrote: Reason for CRM: Work Restrictions >> May 27, 2023  8:36 AM Kathryne Eriksson wrote: Patient states she just found out that she's pregnant and wanting to receive a work restriction. Call back number [phone]

## 2023-05-27 NOTE — Telephone Encounter (Signed)
Copied from CRM [id number]. Topic: Clinical - Lab/Test Results >> May 27, 2023 10:00 AM Andrea Jacobson wrote: Reason for CRM: pt returned a call from [place] [place], regarding labs. Pt is requesting a call back at [phone]

## 2023-06-03 ENCOUNTER — Ambulatory Visit: Payer: Medicaid Other | Admitting: *Deleted

## 2023-06-03 ENCOUNTER — Other Ambulatory Visit (INDEPENDENT_AMBULATORY_CARE_PROVIDER_SITE_OTHER): Payer: Medicaid Other

## 2023-06-03 ENCOUNTER — Other Ambulatory Visit (HOSPITAL_COMMUNITY)
Admission: RE | Admit: 2023-06-03 | Discharge: 2023-06-03 | Disposition: A | Discharge status: Home / Self Care | Payer: Medicaid Other | Source: Ambulatory Visit | Attending: Obstetrics and Gynecology | Admitting: Obstetrics and Gynecology

## 2023-06-03 VITALS — BP 124/77 | HR 69 | Wt 133.3 lb

## 2023-06-03 DIAGNOSIS — Z1339 Encounter for screening examination for other mental health and behavioral disorders: Secondary | ICD-10-CM | POA: Diagnosis not present

## 2023-06-03 DIAGNOSIS — Z3401 Encounter for supervision of normal first pregnancy, first trimester: Secondary | ICD-10-CM

## 2023-06-03 DIAGNOSIS — Z3480 Encounter for supervision of other normal pregnancy, unspecified trimester: Secondary | ICD-10-CM | POA: Insufficient documentation

## 2023-06-03 DIAGNOSIS — Z3A01 Less than 8 weeks gestation of pregnancy: Secondary | ICD-10-CM | POA: Diagnosis not present

## 2023-06-03 NOTE — Patient Instructions (Signed)
[facility] [employer] has a partnership with the [employer] to provide prenatal navigation for the most needed resources in our community. In order to see how we can help connect you to these resources we need consent to contact you. Please complete the very short consent using the link below:   English Link: https://[place].tfaforms.net/283?site=16  Spanish Link: https://[place].tfaforms.net/287?site=16  Our practice his participating in a study that provides no-cost doula care. ACURE4Moms is a study looking at how doula care can reduce birthing disparities for Black and brown birthing people. We like to refer patients as soon as possible, but definitely before 28 weeks so patients can get to know their doula.    A doula is trained to provide support before, during and just after you give birth. While doulas do not provide medical care, they do provide emotional, physical and educational support. Doulas can help reduce your stress and comfort you and your partner. They can help you cope with labor by helping you use breathing techniques, massage, creative labor positioning, essential oils and affirmations.   ACURE4Moms is a research study trying to reduce:   low birthweight babies  emergency department visits & hospitalizations for birthing persons and their babies  depression among birthing people  discrimination in pregnancy-related care ACURE4Moms is trying out 2 programs designed by  people who have given birth. These programs include: 1. Sharing patient data and warning alerts with clinic staff to keep them accountable for their patients' outcomes and providing tools to help them  reduce bias in care. 2. Matching eligible patients with doulas from the  same community as the patients.  If you would like to participate in this study, please visit:   http://carroll-castaneda.info/  Options for Doula Care in the [place] Area  As you review  your birthing options, consider having a birth doula. A doula is trained to provide support before, during and just after you give birth. There are also postpartum doulas that help you adjust to new parenthood.  While doulas do not provide medical care, they do provide emotional, physical and educational support. A few months before your baby arrives, doulas can help answer questions, ease concerns and help you create and support your birthing plan.    Doulas can help reduce your stress and comfort you and your partner. They can help you cope with labor by helping you use breathing techniques, massage, creative labor positioning, essential oils and affirmations.   Studies show that the benefits of having a doula include:   A more positive birth experience  Fewer requests for pain-relief medication  Less likelihood of cesarean section, commonly called a c-section   Doulas are typically hired via a [employer] account planner between you and the doula. We are happy to provide a list of the most active doulas in the area, all of whom are credentialed by [facility] and will not count as a visitor at your birth.  There are several options for no-cost doula care at our hospital, including:  [facility] Volunteer Doula Program [employer] Program A Cure 4 Moms Doula Study (available only at [employer] for [facility], [place], [place] and [facility] offices)  For more information on these programs or to receive a list of doulas active in our area, please email [email or web address]

## 2023-06-03 NOTE — Progress Notes (Signed)
New OB Intake  I connected with Andrea Jacobson  on 06/03/23 at 10:15 AM EST by In Person Visit and verified that I am speaking with the correct person using two identifiers. Nurse is located at [facility] and pt is located at [place].  I discussed the limitations, risks, security and privacy concerns of performing an evaluation and management service by telephone and the availability of in person appointments. I also discussed with the patient that there may be a patient responsible charge related to this service. The patient expressed understanding and agreed to proceed.  I explained I am completing New OB Intake today. We discussed EDD of Not found.. Pt is G0P0000. I reviewed her allergies, medications and Medical/Surgical/OB history.    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 Discussed the nature of our practice with multiple providers including residents and students. Due to the size of the practice, the delivering provider may not be the same as those providing prenatal care.   Patient is not interested in water birth. Offered upcoming OB visit with CNM to discuss further.  MyChart/Babyscripts MyChart access verified. I explained pt will have some visits in office and some virtually. Babyscripts instructions given and order placed. Patient verifies receipt of registration text/e-mail. Account successfully created and app downloaded. If patient is a candidate for Optimized scheduling, add to sticky note.   Blood Pressure Cuff/Weight Scale Pt has BP cuff at home. Explained after first prenatal appt pt will check weekly and document in  Babyscripts. Patient does not have weight scale; patient may purchase if they desire to track weight weekly in Babyscripts.  Anatomy US Explained first scheduled [place] will be around 19 weeks. Anatomy US scheduled for TBD at TBD.  Interested in [place]? If yes, send referral and doula dot phrase.   Is patient a candidate for Babyscripts Optimization? Yes, patient accepted    First visit review I reviewed new OB appt with patient. Explained pt will be seen by Dr. Clement Sayres at first visit. Discussed Avelina Laine genetic screening with patient. Requests Panorama and Horizon.. Routine prenatal labs  OB Urine and GC/CC only collected at today's visit. OB Panel, Natera deferred to New OB per pt preference.    Last Pap No results found for: "DIAGPAP"  Harrel Lemon, RN 06/03/2023  10:19 AM

## 2023-06-04 ENCOUNTER — Other Ambulatory Visit: Payer: Self-pay | Admitting: Physician Assistant

## 2023-06-04 LAB — CERVICOVAGINAL ANCILLARY ONLY
Chlamydia: NEGATIVE
Comment: NEGATIVE
Comment: NORMAL
Neisseria Gonorrhea: NEGATIVE

## 2023-06-05 LAB — URINE CULTURE, OB REFLEX

## 2023-06-05 LAB — CULTURE, OB URINE

## 2023-06-25 ENCOUNTER — Encounter: Payer: Medicaid Other | Admitting: Obstetrics & Gynecology

## 2023-06-27 NOTE — Progress Notes (Unsigned)
   PRENATAL VISIT NOTE  Subjective:  Andrea Frederic is a 23 y.o. G1P0000 at [redacted]w[redacted]d being seen today for her first prenatal visit for this pregnancy.  She is currently monitored for the following issues for this low-risk pregnancy and has PCOS (polycystic ovarian syndrome); Irregular periods; Vasovagal syncope; Secondary amenorrhea; Hirsutism; Weight gain; Intrinsic eczema; Herpes simplex vulvovaginitis; and Supervision of other normal pregnancy, antepartum on their problem list.  Patient reports {sx:14538}.   .  .   . Denies leaking of fluid.   She is planning to {Blank single:19197::"breastfeed","bottle feed"}. Desires *** for contraception.   The following portions of the patient's history were reviewed and updated as appropriate: allergies, current medications, past family history, past medical history, past social history, past surgical history and problem list.   Objective:  There were no vitals filed for this visit.  Fetal Status:           General:  Alert, oriented and cooperative. Patient is in no acute distress.  Skin: Skin is warm and dry. No rash noted.   Cardiovascular: Normal heart rate and rhythm noted  Respiratory: Normal respiratory effort, no problems with respiration noted. Clear to auscultation.   Abdomen: Soft, gravid, appropriate for gestational age. Normal bowel sounds. Non-tender.       Pelvic: Cervical exam deferred       Normal cervical contour, no lesions, no bleeding following pap, normal discharge  Extremities: Normal range of motion.     Mental Status: Normal mood and affect. Normal behavior. Normal judgment and thought content.    Indications for ASA therapy (per uptodate) One of the following: Previous pregnancy with preeclampsia, especially early onset and with an adverse outcome No Multifetal gestation No Chronic hypertension No Type 1 or 2 diabetes mellitus No Chronic kidney disease No Autoimmune disease (antiphospholipid syndrome, systemic lupus  erythematosus) No  Two or more of the following: Nulliparity Yes Obesity (body mass index >30 kg/m2) {yes/no:20286} Family history of preeclampsia in mother or sister {yes/no:20286} Age >=35 years No Sociodemographic characteristics (African American race, low socioeconomic level) No Personal risk factors (eg, previous pregnancy with low birth weight or small for gestational age infant, previous adverse pregnancy outcome [eg, stillbirth], interval >10 years between pregnancies) No  Assessment and Plan:  Pregnancy: G1P0000 at [redacted]w[redacted]d  1. Supervision of other normal pregnancy, antepartum (Primary) Initial labs drawn. Continue prenatal vitamins. Genetic Screening discussed: NIPS, carrier screening and AFP  Ultrasound discussed; fetal anatomic survey: Schedule 08/26/23 Problem list reviewed and updated. Reviewed Brx optimized schedule, patient agreeable The nature of [facility] with multiple MDs and other Advanced Practice Providers was explained to patient; also emphasized that residents, students are part of our team. Routine obstetric precautions reviewed.   2. [redacted] weeks gestation of pregnancy Anticipatory guidance about next visits/weeks of pregnancy given.   3. Herpes simplex vulvovaginitis ***   Preterm labor/first trimester warning symptoms and general obstetric precautions including but not limited to vaginal bleeding, contractions, leaking of fluid and fetal movement were reviewed in detail with the patient.  Please refer to After Visit Summary for other counseling recommendations.   No follow-ups on file.  Future Appointments  Date Time Provider Department Center  06/28/2023  9:10 AM Ralene Muskrat, [place] [facility] None  08/26/2023  8:15 AM [facility] NURSE [facility]  08/26/2023  8:30 AM [facility] US3 [facility]  05/18/2024  7:30 AM Shamleffer, Konrad Dolores, MD [facility] None    Ralene Muskrat, [place]

## 2023-06-28 ENCOUNTER — Ambulatory Visit: Payer: Medicaid Other | Admitting: Physician Assistant

## 2023-06-28 VITALS — BP 119/70 | HR 69 | Wt 135.4 lb

## 2023-06-28 DIAGNOSIS — Z3A10 10 weeks gestation of pregnancy: Secondary | ICD-10-CM

## 2023-06-28 DIAGNOSIS — Z3401 Encounter for supervision of normal first pregnancy, first trimester: Secondary | ICD-10-CM | POA: Diagnosis not present

## 2023-06-28 DIAGNOSIS — A6004 Herpesviral vulvovaginitis: Secondary | ICD-10-CM

## 2023-06-28 DIAGNOSIS — Z3480 Encounter for supervision of other normal pregnancy, unspecified trimester: Secondary | ICD-10-CM | POA: Diagnosis not present

## 2023-06-28 DIAGNOSIS — Z3481 Encounter for supervision of other normal pregnancy, first trimester: Secondary | ICD-10-CM | POA: Diagnosis not present

## 2023-06-28 MED ORDER — VALACYCLOVIR HCL 500 MG PO TABS: 500.0000 mg | ORAL_TABLET | Freq: Two times a day (BID) | ORAL | 0 refills | Status: AC

## 2023-06-28 NOTE — Progress Notes (Signed)
Pt presents for NOB. No questions or concerns.

## 2023-06-28 NOTE — Patient Instructions (Addendum)
Valtrex is safe in pregnancy and has been sent to your pharmacy to take twice daily by mouth for 10 days. If symptoms do not resolve, please contact us for a longer course of medication.   The genetic test with gender included is called "panorama." I'll try to mark this result ahead of you seeing it to notify you of which one it is.

## 2023-06-29 LAB — CBC/D/PLT+RPR+RH+ABO+RUBIGG...
Antibody Screen: NEGATIVE
Basophils Absolute: 0.1 10*3/uL (ref 0.0–0.2)
Basos: 1 %
EOS (ABSOLUTE): 0.1 10*3/uL (ref 0.0–0.4)
Eos: 1 %
HCV Ab: NONREACTIVE
HIV Screen 4th Generation wRfx: NONREACTIVE
Hematocrit: 34.8 % (ref 34.0–46.6)
Hemoglobin: 11.7 g/dL (ref 11.1–15.9)
Hepatitis B Surface Ag: NEGATIVE
Immature Grans (Abs): 0 10*3/uL (ref 0.0–0.1)
Immature Granulocytes: 0 %
Lymphocytes Absolute: 2.4 10*3/uL (ref 0.7–3.1)
Lymphs: 28 %
MCH: 29.5 pg (ref 26.6–33.0)
MCHC: 33.6 g/dL (ref 31.5–35.7)
MCV: 88 fL (ref 79–97)
Monocytes Absolute: 0.4 10*3/uL (ref 0.1–0.9)
Monocytes: 5 %
Neutrophils Absolute: 5.6 10*3/uL (ref 1.4–7.0)
Neutrophils: 65 %
Platelets: 230 10*3/uL (ref 150–450)
RBC: 3.97 x10E6/uL (ref 3.77–5.28)
RDW: 13 % (ref 11.7–15.4)
RPR Ser Ql: NONREACTIVE
Rh Factor: POSITIVE
Rubella Antibodies, IGG: 1.3 {index} (ref >0.99)
WBC: 8.6 10*3/uL (ref 3.4–10.8)

## 2023-06-29 LAB — HCV INTERPRETATION

## 2023-06-29 LAB — HEMOGLOBIN A1C
Est. average glucose Bld gHb Est-mCnc: 103 mg/dL
Hgb A1c MFr Bld: 5.2 % (ref 4.8–5.6)

## 2023-07-05 LAB — PANORAMA PRENATAL TEST FULL PANEL:PANORAMA TEST PLUS 5 ADDITIONAL MICRODELETIONS: FETAL FRACTION: 13.1

## 2023-07-09 ENCOUNTER — Encounter: Payer: Self-pay | Admitting: Physician Assistant

## 2023-07-09 LAB — HORIZON CUSTOM: REPORT SUMMARY: NEGATIVE

## 2023-07-18 ENCOUNTER — Encounter: Payer: Self-pay | Admitting: Obstetrics and Gynecology

## 2023-07-26 ENCOUNTER — Ambulatory Visit: Payer: Medicaid Other | Admitting: Obstetrics and Gynecology

## 2023-07-26 ENCOUNTER — Encounter: Payer: Self-pay | Admitting: Obstetrics and Gynecology

## 2023-07-26 VITALS — BP 108/59 | HR 71 | Wt 131.0 lb

## 2023-07-26 DIAGNOSIS — Z3A14 14 weeks gestation of pregnancy: Secondary | ICD-10-CM

## 2023-07-26 DIAGNOSIS — Z3480 Encounter for supervision of other normal pregnancy, unspecified trimester: Secondary | ICD-10-CM

## 2023-07-26 DIAGNOSIS — A6004 Herpesviral vulvovaginitis: Secondary | ICD-10-CM

## 2023-07-26 NOTE — Progress Notes (Signed)
 Pt presents for ROB visit. No concerns

## 2023-07-26 NOTE — Progress Notes (Signed)
   PRENATAL VISIT NOTE  Subjective:  Andrea Jacobson is a 23 y.o. G1P0000 at [redacted]w[redacted]d being seen today for ongoing prenatal care.  She is currently monitored for the following issues for this low-risk pregnancy and has PCOS (polycystic ovarian syndrome); Irregular periods; Vasovagal syncope; Secondary amenorrhea; Hirsutism; Weight gain; Intrinsic eczema; Herpes simplex vulvovaginitis; and Supervision of other normal pregnancy, antepartum on their problem list.  Patient reports no complaints.  Contractions: Not present. Vag. Bleeding: None.  Movement: Absent. Denies leaking of fluid.   The following portions of the patient's history were reviewed and updated as appropriate: allergies, current medications, past family history, past medical history, past social history, past surgical history and problem list.   Objective:   Vitals:   07/26/23 0851  BP: (!) 108/59  Pulse: 71  Weight: 131 lb (59.4 kg)    Fetal Status: Fetal Heart Rate (bpm): 153   Movement: Absent     General:  Alert, oriented and cooperative. Patient is in no acute distress.  Skin: Skin is warm and dry. No rash noted.   Cardiovascular: Normal heart rate noted  Respiratory: Normal respiratory effort, no problems with respiration noted  Abdomen: Soft, gravid, appropriate for gestational age.  Pain/Pressure: Absent     Pelvic: Cervical exam deferred        Extremities: Normal range of motion.  Edema: None  Mental Status: Normal mood and affect. Normal behavior. Normal judgment and thought content.   Assessment and Plan:  Pregnancy: G1P0000 at [redacted]w[redacted]d 1. Supervision of other normal pregnancy, antepartum (Primary) BP and FHR normal Doing well overall    2. [redacted] weeks gestation of pregnancy Anatomy scan 4/21  3. Herpes simplex vulvovaginitis Outbreak early pregnancy, none currently Discussed suppression 34-36 weeks  Preterm labor symptoms and general obstetric precautions including but not limited to vaginal bleeding,  contractions, leaking of fluid and fetal movement were reviewed in detail with the patient. Please refer to After Visit Summary for other counseling recommendations.   Return in about 4 weeks (around 08/23/2023) for OB VISIT (MD or APP).  Future Appointments  Date Time Provider Department Center  08/26/2023  8:15 AM [facility] NURSE [facility]  08/26/2023  8:30 AM [facility] US3 [facility]  05/18/2024  7:30 AM Shamleffer, Konrad Dolores, MD [facility] None    Albertine Grates, FNP

## 2023-08-06 DIAGNOSIS — Z3A16 16 weeks gestation of pregnancy: Secondary | ICD-10-CM | POA: Diagnosis not present

## 2023-08-06 DIAGNOSIS — O219 Vomiting of pregnancy, unspecified: Secondary | ICD-10-CM | POA: Diagnosis not present

## 2023-08-06 DIAGNOSIS — R112 Nausea with vomiting, unspecified: Secondary | ICD-10-CM | POA: Diagnosis not present

## 2023-08-21 ENCOUNTER — Ambulatory Visit: Attending: Obstetrics and Gynecology | Admitting: *Deleted

## 2023-08-21 DIAGNOSIS — N926 Irregular menstruation, unspecified: Secondary | ICD-10-CM

## 2023-08-21 DIAGNOSIS — R55 Syncope and collapse: Secondary | ICD-10-CM

## 2023-08-21 DIAGNOSIS — A6004 Herpesviral vulvovaginitis: Secondary | ICD-10-CM

## 2023-08-21 DIAGNOSIS — Z3A18 18 weeks gestation of pregnancy: Secondary | ICD-10-CM

## 2023-08-21 NOTE — Progress Notes (Unsigned)
   PRENATAL VISIT NOTE  Subjective:  Andrea Jacobson is a 23 y.o. G1P0000 at [redacted]w[redacted]d being seen today for ongoing prenatal care.  She is currently monitored for the following issues for this {Blank single:19197::"high-risk","low-risk"} pregnancy and has PCOS (polycystic ovarian syndrome); Irregular periods; Vasovagal syncope; Secondary amenorrhea; Hirsutism; Weight gain; Intrinsic eczema; Herpes simplex vulvovaginitis; and Supervision of other normal pregnancy, antepartum on their problem list.  Patient reports {sx:14538}.   .  .   . Denies leaking of fluid.   The following portions of the patient's history were reviewed and updated as appropriate: allergies, current medications, past family history, past medical history, past social history, past surgical history and problem list.   Objective:  There were no vitals filed for this visit.  Fetal Status:           General:  Alert, oriented and cooperative. Patient is in no acute distress.  Skin: Skin is warm and dry. No rash noted.   Cardiovascular: Normal heart rate noted  Respiratory: Normal respiratory effort, no problems with respiration noted  Abdomen: Soft, gravid, appropriate for gestational age.        Pelvic: Cervical exam deferred        Extremities: Normal range of motion.     Mental Status: Normal mood and affect. Normal behavior. Normal judgment and thought content.   Assessment and Plan:  Pregnancy: G1P0000 at [redacted]w[redacted]d  1. Supervision of other normal pregnancy, antepartum (Primary) Patient is doing well, feeling regular fetal movement  BP, FHR, FH appropriate  2. [redacted] weeks gestation of pregnancy Anticipatory guidance about next visits/weeks of pregnancy given.  08/26/23 anatomy scan   Preterm labor symptoms and general obstetric precautions including but not limited to vaginal bleeding, contractions, leaking of fluid and fetal movement were reviewed in detail with the patient. Please refer to After Visit Summary for other  counseling recommendations.   No follow-ups on file.  Future Appointments  Date Time Provider Department Center  08/21/2023  8:00 AM [facility] NURSE INTAKE [facility]  08/22/2023  8:55 AM Ronnita Paz E, PA-C [facility] None  08/26/2023  8:00 AM [facility] PROVIDER 1 [facility]  08/26/2023  8:30 AM [facility] US3 [facility]  05/18/2024  7:30 AM Shamleffer, Julian Obey, MD [facility] None    Luevenia Saha, PA-C

## 2023-08-21 NOTE — Progress Notes (Signed)
 New OB Intake  I connected with Andrea Jacobson  on 08/21/23 at 1126 by phone and verified that I am speaking with the correct person using two identifiers. Nurse is located at [facility] Care and pt is located at home.   I explained I am completing New Patient Intake today. We discussed EDD of 01/19/2024, by Ultrasound. Pt is G1P0000. I reviewed her allergies, medications and Medical/Surgical/OB history.    Problem List There are no diagnoses linked to this encounter.  OB History  Gravida Para Term Preterm AB Living  1 0 0 0 0 0  SAB IAB Ectopic Multiple Live Births  0 0 0 0 0    # Outcome Date GA Lbr Len/2nd Weight Sex Type Anes PTL Lv  1 Current              Past Medical History:  Diagnosis Date   HSV-2 seropositive    PCOS (polycystic ovarian syndrome)        Past Surgical History:  Procedure Laterality Date   NO PAST SURGERIES       Current Outpatient Medications on File Prior to Visit  Medication Sig Dispense Refill   Prenatal Vit-Fe Fumarate-FA (PRENATAL VITAMIN PO) Take 1 tablet by mouth daily.     No current facility-administered medications on file prior to visit.      No Known Allergies   Patient advised of first appointment in our office and when to arrive.   All questions were answered.

## 2023-08-22 ENCOUNTER — Ambulatory Visit: Admitting: Physician Assistant

## 2023-08-22 VITALS — BP 101/60 | HR 69 | Wt 136.0 lb

## 2023-08-22 DIAGNOSIS — Z3480 Encounter for supervision of other normal pregnancy, unspecified trimester: Secondary | ICD-10-CM | POA: Diagnosis not present

## 2023-08-22 DIAGNOSIS — Z3A18 18 weeks gestation of pregnancy: Secondary | ICD-10-CM | POA: Diagnosis not present

## 2023-08-22 NOTE — Progress Notes (Signed)
 Pt presents for rob. Pt has no questions or concerns at this time.

## 2023-08-24 LAB — AFP, SERUM, OPEN SPINA BIFIDA
AFP MoM: 1.91
AFP Value: 88.4 ng/mL
Gest. Age on Collection Date: 18 wk
Maternal Age At EDD: 20.9 a
OSBR Risk 1 IN: 987
Test Results:: NEGATIVE
Weight: 136 [lb_av]

## 2023-08-26 ENCOUNTER — Encounter: Payer: Self-pay | Admitting: Obstetrics and Gynecology

## 2023-08-26 ENCOUNTER — Other Ambulatory Visit: Payer: Self-pay | Admitting: Obstetrics and Gynecology

## 2023-08-26 ENCOUNTER — Other Ambulatory Visit: Payer: Self-pay | Admitting: *Deleted

## 2023-08-26 ENCOUNTER — Ambulatory Visit: Payer: Medicaid Other

## 2023-08-26 ENCOUNTER — Ambulatory Visit: Payer: Medicaid Other | Attending: Obstetrics and Gynecology

## 2023-08-26 ENCOUNTER — Ambulatory Visit (HOSPITAL_BASED_OUTPATIENT_CLINIC_OR_DEPARTMENT_OTHER): Admitting: Obstetrics and Gynecology

## 2023-08-26 VITALS — BP 110/50 | HR 71

## 2023-08-26 DIAGNOSIS — Q790 Congenital diaphragmatic hernia: Secondary | ICD-10-CM

## 2023-08-26 DIAGNOSIS — O98512 Other viral diseases complicating pregnancy, second trimester: Secondary | ICD-10-CM | POA: Insufficient documentation

## 2023-08-26 DIAGNOSIS — Z3480 Encounter for supervision of other normal pregnancy, unspecified trimester: Secondary | ICD-10-CM

## 2023-08-26 DIAGNOSIS — O35FXX0 Maternal care for other (suspected) fetal abnormality and damage, fetal musculoskeletal anomalies of trunk, not applicable or unspecified: Secondary | ICD-10-CM

## 2023-08-26 DIAGNOSIS — O358XX0 Maternal care for other (suspected) fetal abnormality and damage, not applicable or unspecified: Secondary | ICD-10-CM | POA: Insufficient documentation

## 2023-08-26 DIAGNOSIS — B009 Herpesviral infection, unspecified: Secondary | ICD-10-CM | POA: Diagnosis not present

## 2023-08-26 DIAGNOSIS — Z363 Encounter for antenatal screening for malformations: Secondary | ICD-10-CM | POA: Insufficient documentation

## 2023-08-26 DIAGNOSIS — Z362 Encounter for other antenatal screening follow-up: Secondary | ICD-10-CM

## 2023-08-26 DIAGNOSIS — Z3A19 19 weeks gestation of pregnancy: Secondary | ICD-10-CM | POA: Diagnosis not present

## 2023-08-26 DIAGNOSIS — Z3689 Encounter for other specified antenatal screening: Secondary | ICD-10-CM

## 2023-08-26 NOTE — Progress Notes (Signed)
  Maternal-Fetal Medicine Consultation Name: Andrea Jacobson, [place] MRN: [id number]  G1 P0. Patient is here for fetal anatomy scan. On cell-free fetal DNA screening, the risks of 15 aneuploidies are not increased.  Patient reports no chronic medical conditions including diabetes or hypertension.  She had polycystic ovarian syndrome and was being followed by her endocrinologist.  Hemoglobin A1c was 5.2%.  Patient reports she takes only prenatal vitamins.  No history of any other medications in early pregnancy.  She has no personal history or family history of venous thromboembolism. Patient denies tobacco or drug or alcohol use.  Ultrasound We performed a fetal anatomical survey.  Fetal biometry is consistent with the previously established dates. Amniotic fluid is normal and good fetal activity seen.  Following findings are seen: -Right-sided congenital diaphragmatic hernia. Liver and gall bladder are seen in the chest.  -The heart is displaced to the extreme left. -The stomach is in the abdomen. -Persistent right umbilical vein is seen. Inferior vena cava could not be seen well. -The ribs appear normal. Rest of the fetal anatomy including spine, intracranial structures appear normal. -The O/E Lung-Head-Ratio is 37% Kedric Passy), which carries a good prognosis (above 25%). Lung area was difficult to trace.  Our concerns include: Right-sided congenital diaphragmatic hernia I explained the diagnosis of congenital diaphragmatic hernia [facility]) with the help of ultrasound images and diagrams. - Right sided [facility] represent 10% to 50% of all [facility]. [facility] is associated with chromosomal anomalies and 20% of cases and can be associated with genetic syndromes. - I recommended amniocentesis for fetal karyotype, microarray and whole genome sequence if patient opts for it.  I explained the cell free fetal DNA screening has limitations in detecting all chromosomal anomalies. - [facility] associated with severe pulmonary  hypoplasia and pulmonary hypertension in the newborn.  It carries a higher perinatal mortality rate (up to 40%).  It interferes with lung development leading to pulmonary hypoplasia.   - Fetal intervention is not usually performed with right sided [facility].  I will discuss fetal interventional procedures at her next visit. -I recommended fetal echocardiography to rule out coexisting cardiac anomalies.  After counseling, the patient informed that she would like to return in 2 days with her partner. She scheduled an appointment for amniocentesis.  I discussed delivery at a [facility] with pediatric surgical facilities. Patient is obviously upset about the diagnosis. Her partner had to leave early for an appointment.  She will be returning on Wednesday (patient's preference) and we will discuss the option of termination of pregnancy.  Our genetic counselor, Georgean Kindle was present (but did not counsel the patient today) during counseling.  Recommendations -Amniocentesis in 2 days.  Patient will meet with our genetic counselor. - We have requested an appointment for fetal echocardiography ([facility]).  Consultation including face-to-face (more than 50%) counseling 45 minutes.

## 2023-08-29 ENCOUNTER — Ambulatory Visit: Attending: Obstetrics and Gynecology | Admitting: Obstetrics and Gynecology

## 2023-08-29 ENCOUNTER — Ambulatory Visit (HOSPITAL_BASED_OUTPATIENT_CLINIC_OR_DEPARTMENT_OTHER)

## 2023-08-29 ENCOUNTER — Ambulatory Visit

## 2023-08-29 DIAGNOSIS — Q790 Congenital diaphragmatic hernia: Secondary | ICD-10-CM | POA: Diagnosis not present

## 2023-08-29 DIAGNOSIS — O358XX0 Maternal care for other (suspected) fetal abnormality and damage, not applicable or unspecified: Secondary | ICD-10-CM

## 2023-08-29 DIAGNOSIS — O35FXX0 Maternal care for other (suspected) fetal abnormality and damage, fetal musculoskeletal anomalies of trunk, not applicable or unspecified: Secondary | ICD-10-CM

## 2023-08-29 DIAGNOSIS — Z3A19 19 weeks gestation of pregnancy: Secondary | ICD-10-CM

## 2023-08-29 DIAGNOSIS — B009 Herpesviral infection, unspecified: Secondary | ICD-10-CM | POA: Insufficient documentation

## 2023-08-29 DIAGNOSIS — Z362 Encounter for other antenatal screening follow-up: Secondary | ICD-10-CM | POA: Diagnosis not present

## 2023-08-29 DIAGNOSIS — O98512 Other viral diseases complicating pregnancy, second trimester: Secondary | ICD-10-CM | POA: Insufficient documentation

## 2023-08-29 DIAGNOSIS — O99891 Other specified diseases and conditions complicating pregnancy: Secondary | ICD-10-CM

## 2023-08-29 DIAGNOSIS — O283 Abnormal ultrasonic finding on antenatal screening of mother: Secondary | ICD-10-CM

## 2023-08-29 DIAGNOSIS — O35DXX0 Maternal care for other (suspected) fetal abnormality and damage, fetal gastrointestinal anomalies, not applicable or unspecified: Secondary | ICD-10-CM | POA: Diagnosis not present

## 2023-08-29 NOTE — Progress Notes (Signed)
 Maternal-Fetal Medicine Consultation Name: Andrea Jacobson MRN: [id number]  G1 P0 at 19w 4d gestation. Right sided congenital diaphragmatic hernia was diagnosed at ultrasound performed 3 days ago.  Patient returned for amniocentesis, and she met with our genetic counselor.  She was accompanied by her partner.  Ultrasound A limited ultrasound study was performed.  Amniotic fluid is normal and good fetal activity seen.  Right-sided congenital diaphragmatic hernia is seen.  Liver and gallbladder are present in the chest.  The stomach is in the abdomen on the left side. The heart is pushed to the extreme left.  For the benefit of her partner, I counseled the couple on right sided [facility].  It is present in about 10% to 15% of diaphragmatic hernias.  It has a higher perinatal mortality than the left-sided [facility].  Pulmonary hypoplasia is the most common complication.  I briefly discussed fetal intervention procedure (FETO).  Some centers to the procedure between 35- and 29-weeks' gestation and include right sided [facility]. I informed the patient that we will refer her to [facility] for a consultation if she is interested in fetal intervention procedure.  The couple mentioned that they are not in favor of fetal intervention now.  After discussing between themselves, she decided to continue her pregnancy and deliver at the [facility].  We will refer her to [facility].  I explained amniocentesis procedure and possible complication of miscarriage (1 and 500 procedure).  Fetal anomalies associated with an increased risk of miscarriage that is independent of the procedure. Patient opted to have amniocentesis.  After informed consent, amniocentesis was performed by Dr. Arnie Bibber under ultrasound guidance and initial 3 mL of fluid was discarded to prevent maternal cell contamination.  Additional 50 mL of fluid was withdrawn and split into 2 sets of tubes.  Fluid was sent for AF AFP and fetal  karyotype to [facility], and for microarray and whole genome sequence to [facility]. Patient tolerated the procedure well.  Postprocedure fetal heart rate is normal.  We gave her postprocedure instructions.  Blood type is O positive.  Recommendations - Patient has a follow-up ultrasound appointment on 09/23/2023.  Consultation including face-to-face (more than 50%) counseling 30 minutes.

## 2023-08-29 NOTE — Progress Notes (Addendum)
 [facility] for Maternal Fetal Care at [facility] [address] Phone:  [phone]   Fax:  [phone]      In-Person Genetic Counseling Clinic Note:   I spoke with 23 y.o. Andrea Jacobson today to discuss congenital diaphragmatic hernia findings on ultrasound. She was referred by Andrea Hammersmith, MD. She was accompanied by FOB Andrea Jacobson.   Pregnancy History:    G1P0. EGA: [redacted]w[redacted]d by LMP. EDD: 01/19/2024. Reports two seizure episodes, once in 11/2020 and the other in 2024. She was seen at the ED for the episode that occurred in 2022. She was not seen for the episode in 2024. She is not followed by a neurologist and does not take medications. Seizures can occur secondary to a variety of environmental, lifestyle, and genetic factors. When epilepsy does not have an identified genetic cause, the chance that a child of an affected mother will also have or develop epilepsy is approximately 4%. However, without knowing the etiology of the seizures in the family, precise risk assessment is limited.  I suggested she inform her PCP about this history so they can make appropriate referrals if indicated. Denies personal history of diabetes, high blood pressure, thyroid  conditions, and seizures. Denies bleeding, infections, and fevers in this pregnancy. Denies using tobacco, alcohol, or street drugs in this pregnancy.   Family History:    A three-generation pedigree was created and scanned into Epic under the Media tab.  Maternal ethnicity reported as Hispanic and paternal ethnicity reported as Black. Denies Ashkenazi Jewish ancestry.  Family history not remarkable for consanguinity, individuals with birth defects, intellectual disability, autism spectrum disorder, multiple spontaneous abortions, still births, or unexplained neonatal death.   Congenital Diaphragmatic Hernia:  Andrea Jacobson had an anatomy ultrasound performed on 08/26/2023 in which a congenital diaphragmatic  hernia [facility]) was noted. This is most likely a right-sided [facility] with herniation of the liver and gallbladder into the chest. Please see ultrasound report for details.   We reviewed information about congenital diaphragmatic hernias [facility]). The diaphragm is a thin curved muscle layer that separates the abdominal cavity from the chest cavity. This muscle aids in respiration by expanding and contracting, allowing an individual to inhale and exhale. The diaphragm closes at approximately 10-12 weeks' gestation. Incomplete closure of the diaphragm leads to an opening between the abdominal and chest cavities, which may result in herniation of abdominal organs (stomach, intestines, and/or liver) into the chest cavity. When this occurs, it is referred to as a [facility]. Most cases (~80%) of [facility] are left-sided.   We reviewed that prognosis will depend on factors such as the severity of the [facility], degree of pulmonary hypoplasia, the presence of liver in the fetal chest, polyhydramnios, other anatomical differences such as a heart defect, and associated genetic or chromosome findings.    Delivery at a tertiary care center is recommended in cases of [facility] due to access to neonatology, pediatric surgery, and extracorporeal membrane oxygenation (ECMO). Moreover, experimental in utero repair by fetoscopic tracheal occlusion (FETO) for [facility] is available at certain hospital systems, such as [employer]. This is available to patients who meet certain criteria, including having an anatomically and chromosomally typical fetus (necessitating amniocentesis), the [facility] being left-sided with the liver herniated, and the infant having severe pulmonary hypoplasia. Additionally, a family must be able to remain close geographically to the FETO site during the time period of tracheal occlusion until delivery, necessitating relocation and time off of work for an extended period.  We discussed that around half of [facility] cases are isolated, and around  15-30% of [facility] cases are due to a chromosomal or genetic condition. [facility] can be associated with Down syndrome (trisomy 69), trisomy 27, and isochromosome 12p (Pallister-Killian syndrome). Single gene conditions include Fryns syndrome, Cornelia de Lange syndrome, and Donnai-Barrow syndrome.  We reviewed that diagnostic testing is the only way to definitively test for genetic conditions prenatally. We reviewed genes, chromosomes, and inheritance patterns. We discussed available prenatal diagnostic testing options from amniocentesis including chromosomal analysis and genome sequencing. We reviewed the technical aspects, benefits, risks, and limitations of these tests, including the consent form for [facility] prenatal genome. The couple wishes to obtain as much information as possible. Amniocentesis was performed today by Dr. Arnie Bibber, MFM. We ordered karyotype and AF-AFP through [facility] as well as trio prenatal genome through [employer]. We also discussed the benefits and risks duo vs trio analysis. FOB consented to have his blood drawn. FOB's sample will be collected tomorrow, as he had to leave for work. The couple opted out of [facility] secondary findings and opted in for research participation for prenatal genome. We also discussed genetic discrimination laws (GINA) that apply to health insurance and employers with greater than 15 employees but do not apply to life and long term care and disability insurance.   We reviewed that if a chromosomal or single gene condition is detected, this can help inform us  of additional clinical features, prognosis, and next available steps. We reviewed that this can allow them to connect with different specialists for the care of the newborn, and it can provide additional information to assist in the decision of continuation of the pregnancy or termination.    Previous Testing Completed:  Low risk NIPS: Andrea Jacobson previously completed noninvasive prenatal screening (NIPS) in this  pregnancy. The result is low risk, consistent with a female fetus. This screening significantly reduces but does not eliminate the chance that the current pregnancy has Down syndrome (trisomy 29), trisomy 14, trisomy 88, common sex chromosome conditions, and 22q11.2 microdeletion syndrome. Please see report for details. There are many genetic conditions that cannot be detected by NIPS.   Negative carrier screening: Andrea Jacobson previously completed carrier screening. She screened to not be a carrier for cystic fibrosis (CF), spinal muscular atrophy (SMA), alpha thalassemia, and beta hemoglobinopathies. Please see report for details. A negative result on carrier screening reduces but does not eliminate the chance of being a carrier.   Negative ms-AFP screening: Andrea Jacobson previously completed a maternal serum AFP screen in this pregnancy. The result is screen negative. Please see report for details. A negative result reduces the risk that the current pregnancy has an open neural tube defect. Closed neural tube defects and some open defects may not be detected by this screen.   Plan of Care:   Karyotype and AF-AFP were ordered through [facility] on the amniotic fluid sample. Trio whole genome sequencing through [employer] was ordered on the sample. FOB will come in tomorrow to have his blood drawn for trio whole genome sequencing.   Informed consent was obtained. All questions were answered.   135 minutes were spent on the date of the encounter in service to the patient including preparation, face-to-face consultation, discussion of test reports and available next steps, pedigree construction, genetic risk assessment, documentation, and care coordination.    Thank you for sharing in the care of Andrea Jacobson with us .  Please do not hesitate to contact us  at [phone] if you have any questions.  Andrea Kindle, MS, [facility] Certified Genetic Counselor   Genetic counseling student involved in appointment: No.

## 2023-09-12 LAB — MCC TRACKING

## 2023-09-13 ENCOUNTER — Telehealth: Payer: Self-pay

## 2023-09-13 NOTE — Telephone Encounter (Signed)
 I called the patient to return partial test results from her recent amniocentesis procedure.   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 No aneuploidies or structural rearrangements in the chromosomes were detected by the lab.  AF-AFP wnl.  Whole genome sequencing is pending.  Georgean Kindle, MS, [facility] Certified Genetic Counselor [facility] for Maternal Fetal Care [phone]

## 2023-09-17 ENCOUNTER — Telehealth: Payer: Self-pay

## 2023-09-17 NOTE — Progress Notes (Unsigned)
   PRENATAL VISIT NOTE  Subjective:  Andrea Jacobson is a 23 y.o. G1P0000 at [redacted]w[redacted]d being seen today for ongoing prenatal care.  She is currently monitored for the following issues for this pregnancy and has PCOS (polycystic ovarian syndrome); Irregular periods; Vasovagal syncope; Hirsutism; Weight gain; Herpes simplex vulvovaginitis; Supervision of other normal pregnancy, antepartum; and Diaphragmatic hernia of fetus affecting antepartum care of mother on their problem list.  Patient reports concerns about [facility].  Contractions: Not present. Vag. Bleeding: None.  Movement: Present. Denies leaking of fluid.   The following portions of the patient's history were reviewed and updated as appropriate: allergies, current medications, past family history, past medical history, past social history, past surgical history and problem list.   Objective:   Vitals:   09/19/23 0837  BP: 105/63  Pulse: 76  Weight: 140 lb 3.2 oz (63.6 kg)     Fetal Status: Fetal Heart Rate (bpm): 142 Fundal Height: 23 cm Movement: Present      General: Alert, oriented and cooperative. Patient is in no acute distress.  Skin: Skin is warm and dry. No rash noted.   Cardiovascular: Normal heart rate noted  Respiratory: Normal respiratory effort, no problems with respiration noted  Abdomen: Soft, gravid, appropriate for gestational age.  Pain/Pressure: Absent     Pelvic: Cervical exam deferred        Extremities: Normal range of motion.  Edema: None  Mental Status: Normal mood and affect. Normal behavior. Normal judgment and thought content.   Assessment and Plan:  Pregnancy: G1P0000 at [redacted]w[redacted]d  1. Supervision of other normal pregnancy, antepartum (Primary) Patient doing well, feeling regular fetal movement  BP, FHR, FH appropriate  2. [redacted] weeks gestation of pregnancy Anticipatory guidance about next visits/weeks of pregnancy given.   3. Diaphragmatic hernia of fetus affecting antepartum care of mother, single or  unspecified fetus R-sided [facility]  08/29/23 amniocentesis negative 09/19/23 fetal echo at [facility] F/u OB US  09/23/23 Referred for delivery at [facility]   Preterm labor symptoms and general obstetric precautions including but not limited to vaginal bleeding, contractions, leaking of fluid and fetal movement were reviewed in detail with the patient.  Please refer to After Visit Summary for other counseling recommendations.   Return in about 4 weeks (around 10/17/2023) for LOB.  Future Appointments  Date Time Provider Department Center  09/23/2023  1:00 PM [facility] PROVIDER 1 [facility]  09/23/2023  1:30 PM [facility] US2 [facility]  10/17/2023  9:15 AM Orly Quimby E, PA-C [facility] None  05/18/2024  7:30 AM Shamleffer, Julian Obey, MD [facility] None    Luevenia Saha, PA-C

## 2023-09-17 NOTE — Telephone Encounter (Signed)
 I spoke with the patient to return her amniocentesis results.  Variantyx trio whole genome sequencing returned as negative. No copy number variants or pathogenic variants associated with [facility] or early-onset conditions were detected by the lab. This does not exclude a genetic condition in the fetus due to limitations of testing.  Karyotype returned as 62, XX. No aneuploidies or structural rearrangements were detected by the lab.  AF-AFP wnl.  We reviewed that next steps and delivery plans can be discussed during her upcoming ultrasound visits.  Georgean Kindle, MS, [facility] Certified Genetic Counselor [facility] [phone]

## 2023-09-18 DIAGNOSIS — H5213 Myopia, bilateral: Secondary | ICD-10-CM | POA: Diagnosis not present

## 2023-09-19 ENCOUNTER — Encounter: Payer: Self-pay | Admitting: Physician Assistant

## 2023-09-19 ENCOUNTER — Ambulatory Visit (INDEPENDENT_AMBULATORY_CARE_PROVIDER_SITE_OTHER): Admitting: Physician Assistant

## 2023-09-19 VITALS — BP 105/63 | HR 76 | Wt 140.2 lb

## 2023-09-19 DIAGNOSIS — Z3480 Encounter for supervision of other normal pregnancy, unspecified trimester: Secondary | ICD-10-CM

## 2023-09-19 DIAGNOSIS — O35FXX0 Maternal care for other (suspected) fetal abnormality and damage, fetal musculoskeletal anomalies of trunk, not applicable or unspecified: Secondary | ICD-10-CM

## 2023-09-19 DIAGNOSIS — Z3A22 22 weeks gestation of pregnancy: Secondary | ICD-10-CM

## 2023-09-19 DIAGNOSIS — O359XX0 Maternal care for (suspected) fetal abnormality and damage, unspecified, not applicable or unspecified: Secondary | ICD-10-CM | POA: Diagnosis not present

## 2023-09-19 NOTE — Progress Notes (Signed)
 Wants to discuss [facility] diagnosis of baby.  No other concerns at this time.

## 2023-09-21 LAB — MCC TRACKING

## 2023-09-23 ENCOUNTER — Ambulatory Visit

## 2023-09-23 ENCOUNTER — Other Ambulatory Visit: Payer: Self-pay | Admitting: *Deleted

## 2023-09-23 ENCOUNTER — Ambulatory Visit: Attending: Obstetrics and Gynecology | Admitting: Obstetrics and Gynecology

## 2023-09-23 DIAGNOSIS — B009 Herpesviral infection, unspecified: Secondary | ICD-10-CM | POA: Diagnosis not present

## 2023-09-23 DIAGNOSIS — O99891 Other specified diseases and conditions complicating pregnancy: Secondary | ICD-10-CM

## 2023-09-23 DIAGNOSIS — O35FXX0 Maternal care for other (suspected) fetal abnormality and damage, fetal musculoskeletal anomalies of trunk, not applicable or unspecified: Secondary | ICD-10-CM | POA: Diagnosis not present

## 2023-09-23 DIAGNOSIS — O98519 Other viral diseases complicating pregnancy, unspecified trimester: Secondary | ICD-10-CM | POA: Diagnosis not present

## 2023-09-23 DIAGNOSIS — Z362 Encounter for other antenatal screening follow-up: Secondary | ICD-10-CM | POA: Insufficient documentation

## 2023-09-23 DIAGNOSIS — Q790 Congenital diaphragmatic hernia: Secondary | ICD-10-CM | POA: Diagnosis not present

## 2023-09-23 DIAGNOSIS — O98512 Other viral diseases complicating pregnancy, second trimester: Secondary | ICD-10-CM

## 2023-09-23 DIAGNOSIS — O358XX0 Maternal care for other (suspected) fetal abnormality and damage, not applicable or unspecified: Secondary | ICD-10-CM | POA: Insufficient documentation

## 2023-09-23 DIAGNOSIS — Z3A23 23 weeks gestation of pregnancy: Secondary | ICD-10-CM

## 2023-09-23 LAB — MATERNAL CELL CONTAMINATION

## 2023-09-23 LAB — AFP, AMNIOTIC FLUID
AFP, Amniotic Fluid (mcg/ml): 4.6 ug/mL
Gestational Age(Wks): 19
MOM, Amniotic Fluid: 0.59

## 2023-09-23 LAB — CHROMOSOME, AMNIOTIC FLUID
Cells Analyzed: 15
Cells Counted: 15
Cells Karyotyped: 2
Colonies: 15
GTG Band Resolution Achieved: 450

## 2023-09-23 NOTE — Progress Notes (Signed)
 Maternal-Fetal Medicine Consultation Name: Andrea Jacobson MRN: [id number]  G1 P0 at 23w 1d gestation.  Right-sided congenital diaphragmatic hernia.  Amniocentesis showed normal fetal chromosomes (46, XX) and genome sequencing showed no pathogenic variants.  Patient has an appointment on 10/17/2023 with MFM at [facility], where she will be delivery.  Fetal echocardiography was reported as normal.  Right-sided umbilical vein is seen. Patient had opted not to have fetal surgery.  Ultrasound Fetal growth is appropriate for gestational age.  Amniotic fluid normal good fetal activity seen.  Right-sided congenital diaphragmatic hernia is seen again.  Liver and gallbladder are present and on color Doppler flow, suprahepatic veins are seen. Contralateral lung area was measured and O/E LHR was 7.8% ([place]).  I counseled the patient on the findings. 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 Reassured the patient of normal amniocentesis results (absence syndromes). We will comanage with MFM, [facility].  Recommendations -An appointment was made for her in 5 weeks for limited ultrasound. -Fetal growth assessments to be performed at [facility].  Consultation including face-to-face (more than 50%) counseling 10 minutes.

## 2023-10-07 ENCOUNTER — Ambulatory Visit: Payer: Self-pay

## 2023-10-17 ENCOUNTER — Encounter: Admitting: Physician Assistant

## 2023-10-17 DIAGNOSIS — Z3A26 26 weeks gestation of pregnancy: Secondary | ICD-10-CM | POA: Diagnosis not present

## 2023-10-17 DIAGNOSIS — O35FXX0 Maternal care for other (suspected) fetal abnormality and damage, fetal musculoskeletal anomalies of trunk, not applicable or unspecified: Secondary | ICD-10-CM | POA: Diagnosis not present

## 2023-10-17 DIAGNOSIS — K449 Diaphragmatic hernia without obstruction or gangrene: Secondary | ICD-10-CM | POA: Diagnosis not present

## 2023-10-17 DIAGNOSIS — Q790 Congenital diaphragmatic hernia: Secondary | ICD-10-CM | POA: Diagnosis not present

## 2023-10-17 DIAGNOSIS — O99891 Other specified diseases and conditions complicating pregnancy: Secondary | ICD-10-CM | POA: Diagnosis not present

## 2023-10-17 DIAGNOSIS — R569 Unspecified convulsions: Secondary | ICD-10-CM | POA: Diagnosis not present

## 2023-10-18 ENCOUNTER — Ambulatory Visit (INDEPENDENT_AMBULATORY_CARE_PROVIDER_SITE_OTHER): Admitting: Physician Assistant

## 2023-10-18 ENCOUNTER — Encounter: Payer: Self-pay | Admitting: Physician Assistant

## 2023-10-18 VITALS — BP 95/62 | HR 79 | Wt 147.2 lb

## 2023-10-18 DIAGNOSIS — Z3A26 26 weeks gestation of pregnancy: Secondary | ICD-10-CM

## 2023-10-18 DIAGNOSIS — O35FXX0 Maternal care for other (suspected) fetal abnormality and damage, fetal musculoskeletal anomalies of trunk, not applicable or unspecified: Secondary | ICD-10-CM

## 2023-10-18 DIAGNOSIS — Z3480 Encounter for supervision of other normal pregnancy, unspecified trimester: Secondary | ICD-10-CM | POA: Diagnosis not present

## 2023-10-18 NOTE — Progress Notes (Signed)
   PRENATAL VISIT NOTE  Subjective:  Andrea Jacobson is a 23 y.o. G1P0000 at [redacted]w[redacted]d being seen today for ongoing prenatal care.  She is currently monitored for the following issues for this low-risk pregnancy and has PCOS (polycystic ovarian syndrome); Irregular periods; Vasovagal syncope; Hirsutism; Weight gain; Herpes simplex vulvovaginitis; Supervision of other normal pregnancy, antepartum; and Diaphragmatic hernia of fetus affecting antepartum care of mother on their problem list.  Patient reports questions regarding fetal lung development, survival rates, possible surgery.   Contractions: Not present. Vag. Bleeding: None.  Movement: Increased. Denies leaking of fluid.   The following portions of the patient's history were reviewed and updated as appropriate: allergies, current medications, past family history, past medical history, past social history, past surgical history and problem list.   Objective:    Vitals:   10/18/23 1004  BP: 95/62  Pulse: 79  Weight: 147 lb 3.2 oz (66.8 kg)    Fetal Status:  Fetal Heart Rate (bpm): 141 Fundal Height: 26 cm Movement: Increased    General: Alert, oriented and cooperative. Patient is in no acute distress.  Skin: Skin is warm and dry. No rash noted.   Cardiovascular: Normal heart rate noted  Respiratory: Normal respiratory effort, no problems with respiration noted  Abdomen: Soft, gravid, appropriate for gestational age.  Pain/Pressure: Present     Pelvic: Cervical exam deferred        Extremities: Normal range of motion.  Edema: None  Mental Status: Normal mood and affect. Normal behavior. Normal judgment and thought content.   Assessment and Plan:  Pregnancy: G1P0000 at [redacted]w[redacted]d  1. Supervision of other normal pregnancy, antepartum (Primary) Patient doing well, feeling regular fetal movement  BP, FHR, FH appropriate   2. [redacted] weeks gestation of pregnancy Anticipatory guidance about next visits/weeks of pregnancy given.  GTT/28w labs  next visit. Discussed fasting.  3. 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  Please refer to After Visit Summary for other counseling recommendations.   No follow-ups on file.  Future Appointments  Date Time Provider Department Center  10/29/2023  1:00 PM [facility] PROVIDER 1 [facility]  10/29/2023  1:30 PM [facility] US4 [facility]  11/01/2023  8:35 AM Ayesha Markwell E, PA-C [facility] None  05/18/2024  7:30 AM Shamleffer, Julian Obey, MD [facility] None    Luevenia Saha, PA-C

## 2023-10-18 NOTE — Progress Notes (Signed)
Pt states everything is going well

## 2023-10-22 DIAGNOSIS — O359XX0 Maternal care for (suspected) fetal abnormality and damage, unspecified, not applicable or unspecified: Secondary | ICD-10-CM | POA: Diagnosis not present

## 2023-10-23 ENCOUNTER — Telehealth: Payer: Self-pay

## 2023-10-23 ENCOUNTER — Telehealth: Payer: Self-pay | Admitting: Neurology

## 2023-10-23 NOTE — Telephone Encounter (Signed)
 Left message on VM for patient. Dr. Godwin Lat received a call from Dr. Arnie Bibber regarding needing to get her in for neurology clearance due to history of seizure like activity, needs urgent fetal intervention, but they need clearance from neuro prior to. There is an appt on hold for her on Monday 6/23 with Dr. Samara Crest at 1:15PM

## 2023-10-23 NOTE — Telephone Encounter (Signed)
 Attempted to call patient to inform her that [facility] will be reaching out to schedule an appointment with Dr. Samara Crest. Left message with callback number.

## 2023-10-28 ENCOUNTER — Encounter: Payer: Self-pay | Admitting: Neurology

## 2023-10-28 ENCOUNTER — Ambulatory Visit: Admitting: Neurology

## 2023-10-28 VITALS — BP 110/74 | Ht 61.0 in | Wt 149.0 lb

## 2023-10-28 DIAGNOSIS — R55 Syncope and collapse: Secondary | ICD-10-CM

## 2023-10-28 NOTE — Progress Notes (Signed)
 [facility]  PATIENT: Andrea Jacobson DOB: Jul 19, 2000  REQUESTING CLINICIAN: Purcell Emil Schanz, * HISTORY FROM: Patient/Chart review  REASON FOR VISIT: Need surgical clearance    HISTORICAL  CHIEF COMPLAINT:  Chief Complaint  Patient presents with   New Patient (Initial Visit)    Rm 13, NP, alone, last sz early 2024, [redacted] weeks pregnant. Needs surgical clearance for fetal procedure intevention    HISTORY OF PRESENT ILLNESS:  This is a 23 year old, [redacted] weeks pregnant patient who is presenting needing surgical clearance for fetal procedure.  The surgical clearance is needed for history of seizure like activity.  Patient tells me her first event was in 2022, reported passing out and having seizure activity.  She was admitted in the hospital, had long-term EEG monitoring which was normal.  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  In the hospital she was put on Keppra  but on discharge Keppra  was discontinued.  She was doing well until December 2023, when she did have another event.  She did have a ingrown toenail removed, after the procedure she felt nauseous lightheaded and passed out.  Again she was seen in the hospital diagnosed with syncope and discharged home.  She tells me since then she had one additional event on March 2025.  She tells me the day of that syncopal episode, she did not have breakfast.  She denies any previous history of epilepsy, denies any family history of epilepsy or epilepsy risk factors.  Currently she not taking any antiseizure medication.   Handedness: Right handed   Onset: July 2022  Seizure Type: Passing out  Current frequency: Last syncopal episode in March 2025  Any injuries from seizures: Denies   Seizure risk factors: None reported   Previous ASMs: Given Levetiracetam  when admitted in the hospital July 2022  Currenty ASMs: None    ASMs side effects: N/A  Brain Images: Normal head CT   Previous EEGs: Normal EEG    OTHER MEDICAL CONDITIONS: Syncope, PCOS  REVIEW OF SYSTEMS: Full 14 system review of systems performed and negative with exception of: As noted in the HPI   ALLERGIES: No Known Allergies  HOME MEDICATIONS: Outpatient Medications Prior to Visit  Medication Sig Dispense Refill   Prenatal Vit-Fe Fumarate-FA (PRENATAL VITAMIN PO) Take 1 tablet by mouth daily.     No facility-administered medications prior to visit.    PAST MEDICAL HISTORY: Past Medical History:  Diagnosis Date   HSV-2 seropositive    PCOS (polycystic ovarian syndrome)    Secondary amenorrhea 08/21/2022   Seizures (HCC)     PAST SURGICAL HISTORY: Past Surgical History:  Procedure Laterality Date   NO PAST SURGERIES      FAMILY HISTORY: Family History  Problem Relation Age of Onset   Healthy Mother    Healthy Father    Asthma Brother    Diabetes Maternal Aunt    Cancer Maternal Aunt    Heart disease Neg Hx    Hypertension Neg Hx     SOCIAL HISTORY: Social History   Socioeconomic History   Marital status: Single    Spouse name: Not on file   Number of children: Not on file   Years of education: Not on file   Highest education level: Not on file  Occupational History   Not on file  Tobacco Use   Smoking status: Never   Smokeless tobacco: Never  Vaping  Use   Vaping status: Never Used  Substance and Sexual Activity   Alcohol use: Not Currently   Drug use: Never   Sexual activity: Yes    Partners: Male    Birth control/protection: Condom  Other Topics Concern   Not on file  Social History Narrative   Lives at home with mom and brother   Right handed   [redacted] weeks pregnant today   No caffeine   Social Drivers of Corporate investment banker Strain: Not on file  Food Insecurity: Not on file  Transportation Needs: Not on file  Physical Activity: Not on file  Stress: Not on file  Social  Connections: Not on file  Intimate Partner Violence: Not on file     PHYSICAL EXAM  GENERAL EXAM/CONSTITUTIONAL: Vitals:  Vitals:   10/28/23 1314  BP: 110/74  Weight: 149 lb (67.6 kg)  Height: 5' 1 (1.549 m)   Body mass index is 28.15 kg/m. Wt Readings from Last 3 Encounters:  10/28/23 149 lb (67.6 kg)  10/18/23 147 lb 3.2 oz (66.8 kg)  09/19/23 140 lb 3.2 oz (63.6 kg)   Patient is in no distress; well developed, nourished and groomed; neck is supple, gravid abdomen   MUSCULOSKELETAL: Gait, strength, tone, movements noted in Neurologic exam below  NEUROLOGIC: MENTAL STATUS:      No data to display         awake, alert, oriented to person, place and time recent and remote memory intact normal attention and concentration language fluent, comprehension intact, naming intact fund of knowledge appropriate  CRANIAL NERVE:  2nd, 3rd, 4th, 6th - Visual fields full to confrontation, extraocular muscles intact, no nystagmus 5th - facial sensation symmetric 7th - facial strength symmetric 8th - hearing intact 9th - palate elevates symmetrically, uvula midline 11th - shoulder shrug symmetric 12th - tongue protrusion midline  MOTOR:  normal bulk and tone, full strength in the BUE, BLE  SENSORY:  normal and symmetric to light touch  GAIT/STATION:  normal     DIAGNOSTIC DATA (LABS, IMAGING, TESTING) - I reviewed patient records, labs, notes, testing and imaging myself where available.  Lab Results  Component Value Date   WBC 8.6 06/28/2023   HGB 11.7 06/28/2023   HCT 34.8 06/28/2023   MCV 88 06/28/2023   PLT 230 06/28/2023      Component Value Date/Time   NA 139 05/17/2023 1023   K 3.8 05/17/2023 1023   CL 105 05/17/2023 1023   CO2 26 05/17/2023 1023   GLUCOSE 103 05/17/2023 1023   BUN 14 05/17/2023 1023   CREATININE 0.64 05/17/2023 1023   CALCIUM 9.5 05/17/2023 1023   PROT 7.5 11/13/2022 1237   ALBUMIN 4.4 11/13/2022 1237   AST 23 11/13/2022  1237   ALT 32 11/13/2022 1237   ALKPHOS 79 11/13/2022 1237   BILITOT 0.6 11/13/2022 1237   GFRNONAA >60 04/24/2022 1140   Lab Results  Component Value Date   CHOL 127 05/17/2023   HDL 43 (L) 05/17/2023   LDLCALC 67 05/17/2023   TRIG 91 05/17/2023   Lab Results  Component Value Date   HGBA1C 5.2 06/28/2023   No results found for: VITAMINB12 Lab Results  Component Value Date   TSH 1.12 05/15/2022    Head CT 11/2020 No evidence of acute intracranial abnormality.    LTM 11/24/2020 11/24/20 at 22:01, Patient moved her phone. Camera was blocked by family member. No EEG correlation.  11/25/20 at 8:46 am. Patient was sitting  up in the bed during physical examination then fell back on the bed. Eyes were closed and was unresponsive to provider. Later, after few seconds, she opened her eyes and respond to provider. No EEG correlation was seen.    Interpretation: This long term monitoring video EEG performed during wakefulness and sleep is normal for age. The background activity was normal, and no areas of focal slowing or epileptiform abnormalities were noted. No electrographic or electroclinical seizures were recorded. Events of concern were captured in this recording, do not comprise seizures.   ASSESSMENT AND PLAN  23 y.o. year old female  with history of PCOS, syncope who is presenting needing surgical clearance for a fetal intervention procedure.  Per chart review, her episodes are likely nonepileptic, she is currently not on any antiseizure medication and no seizure risk factors identified.  From a neurological standpoint, patient has an acceptable risk with her scheduled procedure.  I would recommend her to continue current medications, continue to follow with eye doctor and return as needed.   1. Syncope, unspecified syncope type     Patient Instructions  Continue current medications  Continue to follow up with your doctors  Return as needed    Per [place]  DMV  statutes, patients with seizures are not allowed to drive until they have been seizure-free for six months.  Other recommendations include using caution when using heavy equipment or power tools. Avoid working on ladders or at heights. Take showers instead of baths.  Do not swim alone.  Ensure the water temperature is not too high on the home water heater. Do not go swimming alone. Do not lock yourself in a room alone (i.e. bathroom). When caring for infants or small children, sit down when holding, feeding, or changing them to minimize risk of injury to the child in the event you have a seizure. Maintain good sleep hygiene. Avoid alcohol.  Also recommend adequate sleep, hydration, good diet and minimize stress.   During the Seizure  - First, ensure adequate ventilation and place patients on the floor on their left side  Loosen clothing around the neck and ensure the airway is patent. If the patient is clenching the teeth, do not force the mouth open with any object as this can cause severe damage - Remove all items from the surrounding that can be hazardous. The patient may be oblivious to what's happening and may not even know what he or she is doing. If the patient is confused and wandering, either gently guide him/her away and block access to outside areas - Reassure the individual and be comforting - Call 911. In most cases, the seizure ends before EMS arrives. However, there are cases when seizures may last over 3 to 5 minutes. Or the individual may have developed breathing difficulties or severe injuries. If a pregnant patient or a person with diabetes develops a seizure, it is prudent to call an ambulance. - Finally, if the patient does not regain full consciousness, then call EMS. Most patients will remain confused for about 45 to 90 minutes after a seizure, so you must use judgment in calling for help. - Avoid restraints but make sure the patient is in a bed with padded side rails - Place the  individual in a lateral position with the neck slightly flexed; this will help the saliva drain from the mouth and prevent the tongue from falling backward - Remove all nearby furniture and other hazards from the area - Provide verbal assurance as the  individual is regaining consciousness - Provide the patient with privacy if possible - Call for help and start treatment as ordered by the caregiver   After the Seizure (Postictal Stage)  After a seizure, most patients experience confusion, fatigue, muscle pain and/or a headache. Thus, one should permit the individual to sleep. For the next few days, reassurance is essential. Being calm and helping reorient the person is also of importance.  Most seizures are painless and end spontaneously. Seizures are not harmful to others but can lead to complications such as stress on the lungs, brain and the heart. Individuals with prior lung problems may develop labored breathing and respiratory distress.    Discussed Patients with epilepsy have a small risk of sudden unexpected death, a condition referred to as sudden unexpected death in epilepsy (SUDEP). SUDEP is defined specifically as the sudden, unexpected, witnessed or unwitnessed, nontraumatic and nondrowning death in patients with epilepsy with or without evidence for a seizure, and excluding documented status epilepticus, in which post mortem examination does not reveal a structural or toxicologic cause for death     No orders of the defined types were placed in this encounter.   No orders of the defined types were placed in this encounter.   Return if symptoms worsen or fail to improve.  I personally spent a total of 45 minutes in the care of the patient today including preparing to see the patient, getting/reviewing separately obtained history, performing a medically appropriate exam/evaluation, counseling and educating, documenting clinical information in the EHR, communicating results, and  medical record review.   Pastor Falling, MD 10/28/2023, 3:08 PM  [facility] [address], [address] [place], [place] [address] [phone]

## 2023-10-28 NOTE — Patient Instructions (Signed)
Continue current medications  Continue to follow up with your doctors  Return as needed

## 2023-10-29 ENCOUNTER — Ambulatory Visit

## 2023-10-30 DIAGNOSIS — O35FXX0 Maternal care for other (suspected) fetal abnormality and damage, fetal musculoskeletal anomalies of trunk, not applicable or unspecified: Secondary | ICD-10-CM | POA: Diagnosis not present

## 2023-11-01 ENCOUNTER — Ambulatory Visit (INDEPENDENT_AMBULATORY_CARE_PROVIDER_SITE_OTHER): Admitting: Physician Assistant

## 2023-11-01 ENCOUNTER — Other Ambulatory Visit

## 2023-11-01 ENCOUNTER — Other Ambulatory Visit: Payer: Self-pay | Admitting: Physician Assistant

## 2023-11-01 VITALS — BP 99/65 | HR 82

## 2023-11-01 DIAGNOSIS — Z3480 Encounter for supervision of other normal pregnancy, unspecified trimester: Secondary | ICD-10-CM

## 2023-11-01 DIAGNOSIS — Z3A28 28 weeks gestation of pregnancy: Secondary | ICD-10-CM

## 2023-11-01 DIAGNOSIS — Z3493 Encounter for supervision of normal pregnancy, unspecified, third trimester: Secondary | ICD-10-CM | POA: Diagnosis not present

## 2023-11-01 DIAGNOSIS — Q790 Congenital diaphragmatic hernia: Secondary | ICD-10-CM

## 2023-11-01 DIAGNOSIS — R112 Nausea with vomiting, unspecified: Secondary | ICD-10-CM

## 2023-11-01 MED ORDER — DOXYLAMINE-PYRIDOXINE 10-10 MG PO TBEC: 2.0000 | DELAYED_RELEASE_TABLET | Freq: Every day | ORAL | 5 refills | Status: DC

## 2023-11-01 NOTE — Progress Notes (Signed)
   PRENATAL VISIT NOTE  Subjective:  Andrea Jacobson is a 23 y.o. G1P0000 at [redacted]w[redacted]d being seen today for ongoing prenatal care.  She is currently monitored for the following issues for this high-risk pregnancy and has PCOS (polycystic ovarian syndrome); Irregular periods; Vasovagal syncope; Hirsutism; Weight gain; Herpes simplex vulvovaginitis; Supervision of other normal pregnancy, antepartum; and Diaphragmatic hernia of fetus affecting antepartum care of mother on their problem list.  Patient reports no complaints.  Contractions: Not present. Vag. Bleeding: None.  Movement: Present. Denies leaking of fluid.   The following portions of the patient's history were reviewed and updated as appropriate: allergies, current medications, past family history, past medical history, past social history, past surgical history and problem list.   Objective:    Vitals:   11/01/23 0847  BP: 99/65  Pulse: 82    Fetal Status:  Fetal Heart Rate (bpm): 135 Fundal Height: 30 cm Movement: Present    General: Alert, oriented and cooperative. Patient is in no acute distress.  Skin: Skin is warm and dry. No rash noted.   Cardiovascular: Normal heart rate noted  Respiratory: Normal respiratory effort, no problems with respiration noted  Abdomen: Soft, gravid, appropriate for gestational age.  Pain/Pressure: Absent     Pelvic: Cervical exam deferred        Extremities: Normal range of motion.  Edema: None  Mental Status: Normal mood and affect. Normal behavior. Normal judgment and thought content.   Assessment and Plan:  Pregnancy: G1P0000 at [redacted]w[redacted]d  1. Supervision of other normal pregnancy, antepartum Patient doing well, feeling regular fetal movement  BP, FHR, FH appropriate Seen by neurology in visit interim for syncope/seizure-like events  2. [redacted] weeks gestation of pregnancy (Primary) - Glucose Tolerance, 2 Hours w/1 Hour - RPR - HIV antibody (with reflex) - CBC  3. Congenital diaphragmatic  hernia Seen with [facility] for evaluation for FETO procedure Followed by [facility] MFM and perinatology   Preterm labor symptoms and general obstetric precautions including but not limited to vaginal bleeding, contractions, leaking of fluid and fetal movement were reviewed in detail with the patient.  Please refer to After Visit Summary for other counseling recommendations.   Return in about 2 weeks (around 11/15/2023) for [facility].  Future Appointments  Date Time Provider Department Center  05/18/2024  7:30 AM Shamleffer, Donell Cardinal, MD [facility] None    Jorene FORBES Moats, PA-C

## 2023-11-01 NOTE — Progress Notes (Signed)
 Pt is in the office for ROB, reports fetal movement, denies pain. VIS provided for tdap vaccine

## 2023-11-04 DIAGNOSIS — R569 Unspecified convulsions: Secondary | ICD-10-CM | POA: Diagnosis not present

## 2023-11-04 DIAGNOSIS — O35FXX0 Maternal care for other (suspected) fetal abnormality and damage, fetal musculoskeletal anomalies of trunk, not applicable or unspecified: Secondary | ICD-10-CM | POA: Diagnosis not present

## 2023-11-04 DIAGNOSIS — O0993 Supervision of high risk pregnancy, unspecified, third trimester: Secondary | ICD-10-CM | POA: Diagnosis not present

## 2023-11-04 DIAGNOSIS — Z3A29 29 weeks gestation of pregnancy: Secondary | ICD-10-CM | POA: Diagnosis not present

## 2023-11-04 DIAGNOSIS — Z23 Encounter for immunization: Secondary | ICD-10-CM | POA: Diagnosis not present

## 2023-11-05 ENCOUNTER — Other Ambulatory Visit

## 2023-11-05 DIAGNOSIS — Z3A29 29 weeks gestation of pregnancy: Secondary | ICD-10-CM | POA: Diagnosis not present

## 2023-11-05 DIAGNOSIS — Z3493 Encounter for supervision of normal pregnancy, unspecified, third trimester: Secondary | ICD-10-CM | POA: Diagnosis not present

## 2023-11-05 LAB — CBC
Hematocrit: 33.5 % — ABNORMAL LOW (ref 34.0–46.6)
Hemoglobin: 10.9 g/dL — ABNORMAL LOW (ref 11.1–15.9)
MCH: 30.1 pg (ref 26.6–33.0)
MCHC: 32.5 g/dL (ref 31.5–35.7)
MCV: 93 fL (ref 79–97)
Platelets: 207 10*3/uL (ref 150–450)
RBC: 3.62 x10E6/uL — ABNORMAL LOW (ref 3.77–5.28)
RDW: 12.1 % (ref 11.7–15.4)
WBC: 8.1 10*3/uL (ref 3.4–10.8)

## 2023-11-05 LAB — HIV ANTIBODY (ROUTINE TESTING W REFLEX): HIV Screen 4th Generation wRfx: NONREACTIVE

## 2023-11-05 LAB — GLUCOSE TOLERANCE, 2 HOURS W/ 1HR: Glucose, Fasting: 69 mg/dL — ABNORMAL LOW (ref 70–91)

## 2023-11-05 LAB — RPR: RPR Ser Ql: NONREACTIVE

## 2023-11-06 ENCOUNTER — Ambulatory Visit: Payer: Self-pay | Admitting: Obstetrics and Gynecology

## 2023-11-06 LAB — GLUCOSE TOLERANCE, 2 HOURS W/ 1HR
Glucose, 1 hour: 83 mg/dL (ref 70–179)
Glucose, 2 hour: 78 mg/dL (ref 70–152)
Glucose, Fasting: 70 mg/dL (ref 70–91)

## 2023-11-11 ENCOUNTER — Ambulatory Visit (HOSPITAL_COMMUNITY): Payer: Self-pay | Admitting: Physician Assistant

## 2023-11-12 ENCOUNTER — Other Ambulatory Visit: Payer: Self-pay

## 2023-11-12 MED ORDER — FERROUS SULFATE 325 (65 FE) MG PO TABS: 325.0000 mg | ORAL_TABLET | ORAL | 2 refills | Status: DC

## 2023-11-14 ENCOUNTER — Encounter: Payer: Self-pay | Admitting: Physician Assistant

## 2023-11-14 ENCOUNTER — Ambulatory Visit: Admitting: Physician Assistant

## 2023-11-14 VITALS — BP 106/65 | HR 95 | Wt 153.0 lb

## 2023-11-14 DIAGNOSIS — D508 Other iron deficiency anemias: Secondary | ICD-10-CM | POA: Diagnosis not present

## 2023-11-14 DIAGNOSIS — Z3480 Encounter for supervision of other normal pregnancy, unspecified trimester: Secondary | ICD-10-CM

## 2023-11-14 DIAGNOSIS — Z3A30 30 weeks gestation of pregnancy: Secondary | ICD-10-CM

## 2023-11-14 DIAGNOSIS — Q790 Congenital diaphragmatic hernia: Secondary | ICD-10-CM

## 2023-11-14 NOTE — Progress Notes (Signed)
   PRENATAL VISIT NOTE  Subjective:  Andrea Jacobson is a 23 y.o. G1P0000 at [redacted]w[redacted]d being seen today for ongoing prenatal care.  She is currently monitored for the following issues for this low-risk pregnancy and has PCOS (polycystic ovarian syndrome); Irregular periods; Vasovagal syncope; Hirsutism; Weight gain; Herpes simplex vulvovaginitis; Supervision of other normal pregnancy, antepartum; and Diaphragmatic hernia of fetus affecting antepartum care of mother on their problem list.  Patient reports no complaints.  Contractions: Not present. Vag. Bleeding: None.  Movement: Increased. Denies leaking of fluid.   The following portions of the patient's history were reviewed and updated as appropriate: allergies, current medications, past family history, past medical history, past social history, past surgical history and problem list.   Objective:    Vitals:   11/14/23 1600  BP: 106/65  Pulse: 95  Weight: 153 lb (69.4 kg)    Fetal Status:  Fetal Heart Rate (bpm): 131 Fundal Height: 32 cm Movement: Increased    General: Alert, oriented and cooperative. Patient is in no acute distress.  Skin: Skin is warm and dry. No rash noted.   Cardiovascular: Normal heart rate noted  Respiratory: Normal respiratory effort, no problems with respiration noted  Abdomen: Soft, gravid, appropriate for gestational age.  Pain/Pressure: Absent     Pelvic: Cervical exam deferred        Extremities: Normal range of motion.  Edema: None  Mental Status: Normal mood and affect. Normal behavior. Normal judgment and thought content.   Assessment and Plan:  Pregnancy: G1P0000 at [redacted]w[redacted]d  1. Supervision of other normal pregnancy, antepartum (Primary) Patient doing well, feeling regular fetal movement  BP, FHR, FH appropriate Reviewed 28 week labs  2. [redacted] weeks gestation of pregnancy Anticipatory guidance about next visits/weeks of pregnancy given.   3. Congenital diaphragmatic hernia Follows [facility]  Intends  to deliver at [facility] if possible  4. Other iron deficiency anemia 11/01/23 Hgb 10.9 Patient aware of need for oral iron supplement   Preterm labor symptoms and general obstetric precautions including but not limited to vaginal bleeding, contractions, leaking of fluid and fetal movement were reviewed in detail with the patient.  Please refer to After Visit Summary for other counseling recommendations.   Return in about 2 weeks (around 11/28/2023).  Future Appointments  Date Time Provider Department Center  11/28/2023  3:00 PM [facility] None  05/18/2024  7:30 AM Shamleffer, Donell Cardinal, MD [facility] None    Jorene FORBES Moats, PA-C

## 2023-11-14 NOTE — Progress Notes (Signed)
 Pt states everything is going well. No concerns today.

## 2023-11-20 ENCOUNTER — Encounter: Payer: Self-pay | Admitting: Physician Assistant

## 2023-11-20 DIAGNOSIS — Z3A31 31 weeks gestation of pregnancy: Secondary | ICD-10-CM | POA: Diagnosis not present

## 2023-11-20 DIAGNOSIS — Z3A34 34 weeks gestation of pregnancy: Secondary | ICD-10-CM | POA: Diagnosis not present

## 2023-11-20 DIAGNOSIS — O35FXX0 Maternal care for other (suspected) fetal abnormality and damage, fetal musculoskeletal anomalies of trunk, not applicable or unspecified: Secondary | ICD-10-CM | POA: Diagnosis not present

## 2023-11-28 ENCOUNTER — Ambulatory Visit: Admitting: Obstetrics and Gynecology

## 2023-11-28 ENCOUNTER — Encounter: Payer: Self-pay | Admitting: Obstetrics and Gynecology

## 2023-11-28 ENCOUNTER — Encounter

## 2023-11-28 VITALS — BP 97/65 | HR 67 | Wt 157.0 lb

## 2023-11-28 DIAGNOSIS — O35FXX1 Maternal care for other (suspected) fetal abnormality and damage, fetal musculoskeletal anomalies of trunk, fetus 1: Secondary | ICD-10-CM | POA: Diagnosis not present

## 2023-11-28 DIAGNOSIS — O98313 Other infections with a predominantly sexual mode of transmission complicating pregnancy, third trimester: Secondary | ICD-10-CM | POA: Diagnosis not present

## 2023-11-28 DIAGNOSIS — Z3A32 32 weeks gestation of pregnancy: Secondary | ICD-10-CM | POA: Diagnosis not present

## 2023-11-28 DIAGNOSIS — A6004 Herpesviral vulvovaginitis: Secondary | ICD-10-CM

## 2023-11-28 DIAGNOSIS — Z3480 Encounter for supervision of other normal pregnancy, unspecified trimester: Secondary | ICD-10-CM

## 2023-11-28 DIAGNOSIS — O35FXX0 Maternal care for other (suspected) fetal abnormality and damage, fetal musculoskeletal anomalies of trunk, not applicable or unspecified: Secondary | ICD-10-CM

## 2023-11-28 NOTE — Progress Notes (Signed)
   PRENATAL VISIT NOTE  Subjective:  Andrea Jacobson is a 23 y.o. G1P0000 at [redacted]w[redacted]d being seen today for ongoing prenatal care.  She is currently monitored for the following issues for this high-risk pregnancy and has PCOS (polycystic ovarian syndrome); Irregular periods; Vasovagal syncope; Hirsutism; Weight gain; Herpes simplex vulvovaginitis; Supervision of other normal pregnancy, antepartum; and Diaphragmatic hernia of fetus affecting antepartum care of mother on their problem list.  Patient reports no complaints.  Contractions: Irritability. Vag. Bleeding: None.  Movement: Present. Denies leaking of fluid.   The following portions of the patient's history were reviewed and updated as appropriate: allergies, current medications, past family history, past medical history, past social history, past surgical history and problem list.   Objective:    Vitals:   11/28/23 1009  BP: 97/65  Pulse: 67  Weight: 157 lb (71.2 kg)    Fetal Status:  Fetal Heart Rate (bpm): 136 Fundal Height: 34 cm Movement: Present    General: Alert, oriented and cooperative. Patient is in no acute distress.  Skin: Skin is warm and dry. No rash noted.   Cardiovascular: Normal heart rate noted  Respiratory: Normal respiratory effort, no problems with respiration noted  Abdomen: Soft, gravid, appropriate for gestational age.  Pain/Pressure: Absent     Pelvic: Cervical exam deferred        Extremities: Normal range of motion.  Edema: None  Mental Status: Normal mood and affect. Normal behavior. Normal judgment and thought content.   Assessment and Plan:  Pregnancy: G1P0000 at [redacted]w[redacted]d 1. Supervision of other normal pregnancy, antepartum (Primary) Patient is doing well and is without complaints Requested authorization letter for delivery at [facility] to be sent to [employer] medicaid Cultures next visit  2. Diaphragmatic hernia of fetus affecting antepartum care of mother, single or unspecified fetus Normal growth  with [facility] on 7/18 Plan to meet with delivery and surgical team on 8/7  3. Herpes simplex vulvovaginitis   Preterm labor symptoms and general obstetric precautions including but not limited to vaginal bleeding, contractions, leaking of fluid and fetal movement were reviewed in detail with the patient. Please refer to After Visit Summary for other counseling recommendations.   Return in about 2 weeks (around 12/12/2023) for in person, ROB, High risk.  Future Appointments  Date Time Provider Department Center  05/18/2024  7:30 AM Shamleffer, Donell Cardinal, MD [facility] None    Winton Felt, MD

## 2023-12-02 ENCOUNTER — Inpatient Hospital Stay (HOSPITAL_COMMUNITY)
Admission: AD | Admit: 2023-12-02 | Discharge: 2023-12-02 | Disposition: A | Discharge status: Home / Self Care | Attending: Obstetrics and Gynecology | Admitting: Obstetrics and Gynecology

## 2023-12-02 ENCOUNTER — Encounter (HOSPITAL_COMMUNITY): Payer: Self-pay | Admitting: Obstetrics and Gynecology

## 2023-12-02 DIAGNOSIS — O4703 False labor before 37 completed weeks of gestation, third trimester: Secondary | ICD-10-CM | POA: Diagnosis not present

## 2023-12-02 DIAGNOSIS — O26893 Other specified pregnancy related conditions, third trimester: Secondary | ICD-10-CM | POA: Diagnosis not present

## 2023-12-02 DIAGNOSIS — O479 False labor, unspecified: Secondary | ICD-10-CM

## 2023-12-02 DIAGNOSIS — N760 Acute vaginitis: Secondary | ICD-10-CM | POA: Diagnosis not present

## 2023-12-02 DIAGNOSIS — O23593 Infection of other part of genital tract in pregnancy, third trimester: Secondary | ICD-10-CM | POA: Diagnosis not present

## 2023-12-02 DIAGNOSIS — B9689 Other specified bacterial agents as the cause of diseases classified elsewhere: Secondary | ICD-10-CM

## 2023-12-02 DIAGNOSIS — Z0371 Encounter for suspected problem with amniotic cavity and membrane ruled out: Secondary | ICD-10-CM | POA: Diagnosis present

## 2023-12-02 DIAGNOSIS — Z3493 Encounter for supervision of normal pregnancy, unspecified, third trimester: Secondary | ICD-10-CM

## 2023-12-02 DIAGNOSIS — Z3A33 33 weeks gestation of pregnancy: Secondary | ICD-10-CM

## 2023-12-02 DIAGNOSIS — Z3689 Encounter for other specified antenatal screening: Secondary | ICD-10-CM

## 2023-12-02 LAB — CBC WITH DIFFERENTIAL/PLATELET
Abs Immature Granulocytes: 0.06 K/uL (ref 0.00–0.07)
Basophils Absolute: 0.1 K/uL (ref 0.0–0.1)
Basophils Relative: 1 %
Eosinophils Absolute: 0.1 K/uL (ref 0.0–0.5)
Eosinophils Relative: 1 %
HCT: 36.7 % (ref 36.0–46.0)
Hemoglobin: 12.1 g/dL (ref 12.0–15.0)
Immature Granulocytes: 1 %
Lymphocytes Relative: 23 %
Lymphs Abs: 2.6 K/uL (ref 0.7–4.0)
MCH: 28.7 pg (ref 26.0–34.0)
MCHC: 33 g/dL (ref 30.0–36.0)
MCV: 87.2 fL (ref 80.0–100.0)
Monocytes Absolute: 0.7 K/uL (ref 0.1–1.0)
Monocytes Relative: 6 %
Neutro Abs: 8.1 K/uL — ABNORMAL HIGH (ref 1.7–7.7)
Neutrophils Relative %: 68 %
Platelets: 235 K/uL (ref 150–400)
RBC: 4.21 MIL/uL (ref 3.87–5.11)
RDW: 12.9 % (ref 11.5–15.5)
WBC: 11.6 K/uL — ABNORMAL HIGH (ref 4.0–10.5)
nRBC: 0 % (ref 0.0–0.2)

## 2023-12-02 LAB — COMPREHENSIVE METABOLIC PANEL WITH GFR
ALT: 13 U/L (ref 0–44)
AST: 23 U/L (ref 15–41)
Albumin: 3 g/dL — ABNORMAL LOW (ref 3.5–5.0)
Alkaline Phosphatase: 119 U/L (ref 38–126)
Anion gap: 9 (ref 5–15)
BUN: 6 mg/dL (ref 6–20)
CO2: 23 mmol/L (ref 22–32)
Calcium: 9 mg/dL (ref 8.9–10.3)
Chloride: 104 mmol/L (ref 98–111)
Creatinine, Ser: 0.58 mg/dL (ref 0.44–1.00)
GFR, Estimated: >60 mL/min (ref >60)
Glucose, Bld: 74 mg/dL (ref 70–99)
Potassium: 4.2 mmol/L (ref 3.5–5.1)
Sodium: 136 mmol/L (ref 135–145)
Total Bilirubin: 0.5 mg/dL (ref 0.0–1.2)
Total Protein: 6.6 g/dL (ref 6.5–8.1)

## 2023-12-02 LAB — PROTEIN / CREATININE RATIO, URINE
Creatinine, Urine: 69 mg/dL
Total Protein, Urine: <6 mg/dL

## 2023-12-02 LAB — WET PREP, GENITAL
Sperm: NONE SEEN
Trich, Wet Prep: NONE SEEN
WBC, Wet Prep HPF POC: >=10 — AB (ref <10)
Yeast Wet Prep HPF POC: NONE SEEN

## 2023-12-02 LAB — URINALYSIS, ROUTINE W REFLEX MICROSCOPIC
Bilirubin Urine: NEGATIVE
Glucose, UA: NEGATIVE mg/dL
Hgb urine dipstick: NEGATIVE
Ketones, ur: 20 mg/dL — AB
Leukocytes,Ua: NEGATIVE
Nitrite: NEGATIVE
Protein, ur: NEGATIVE mg/dL
Specific Gravity, Urine: 1.01 (ref 1.005–1.030)
pH: 7 (ref 5.0–8.0)

## 2023-12-02 LAB — RUPTURE OF MEMBRANE (ROM)PLUS: Rom Plus: NEGATIVE

## 2023-12-02 MED ORDER — METRONIDAZOLE 0.75 % VA GEL: 1.0000 | Freq: Every day | VAGINAL | 1 refills | Status: AC

## 2023-12-02 MED ORDER — CYCLOBENZAPRINE HCL 10 MG PO TABS: 5.0000 mg | ORAL_TABLET | Freq: Three times a day (TID) | ORAL | 0 refills | Status: DC | PRN

## 2023-12-02 MED ORDER — CYCLOBENZAPRINE HCL 5 MG PO TABS
10.0000 mg | ORAL_TABLET | Freq: Once | ORAL | Status: AC
  Administered 2023-12-02: 10 mg via ORAL
  Filled 2023-12-02: qty 2

## 2023-12-02 NOTE — MAU Note (Signed)
 Andrea Jacobson is a 23 y.o. at [redacted]w[redacted]d here in MAU reporting: pain in RUQ comes and goes for 2 wks. Now for last 3 days has been constant. Also having pain in lower abd, cramping x3 days.  No bleeding, having watery d/c- clear- first noted 2 days ago. Onset of complaint: 3days Pain score: RUQ severe, cramping lower moderate- worse when she stands Vitals:   12/02/23 1318  BP: 114/60  Pulse: 81  Resp: 18  Temp: 99.2 F (37.3 C)  SpO2: 100%     FHT:134 Lab orders placed from triage:  urine, fern

## 2023-12-02 NOTE — MAU Provider Note (Signed)
 Chief Complaint:  Abdominal Pain and Rupture of Membranes   HPI   Event Date/Time   First Provider Initiated Contact with Patient 12/02/23 1352      Andrea Jacobson is a 23 y.o. G1P0000 at [redacted]w[redacted]d who presents to maternity admissions reporting possible ROM last night around 2300, clear fluid noted, denies odor. Reports feeling regular and vigorous fetal movement. Denies VB. Reports colicky RUQ for several days, with an increase/more constant pain for approximately 3 days. She denies HA or visual changes.  .   Pregnancy Course:  [facility] with [facility] to deliver at [facility] or [facility]  Past Medical History:  Diagnosis Date   HSV-2 seropositive    PCOS (polycystic ovarian syndrome)    Secondary amenorrhea 08/21/2022   Seizures (HCC)    OB History  Gravida Para Term Preterm AB Living  1 0 0 0 0 0  SAB IAB Ectopic Multiple Live Births  0 0 0 0 0    # Outcome Date GA Lbr Len/2nd Weight Sex Type Anes PTL Lv  1 Current            Past Surgical History:  Procedure Laterality Date   NO PAST SURGERIES     Family History  Problem Relation Age of Onset   Healthy Mother    Healthy Father    Asthma Brother    Diabetes Maternal Aunt    Cancer Maternal Aunt    Heart disease Neg Hx    Hypertension Neg Hx    Social History   Tobacco Use   Smoking status: Never   Smokeless tobacco: Never  Vaping Use   Vaping status: Never Used  Substance Use Topics   Alcohol use: Not Currently   Drug use: Never   No Known Allergies No medications prior to admission.    I have reviewed patient's Past Medical Hx, Surgical Hx, Family Hx, Social Hx, medications and allergies.   ROS  Pertinent items noted in HPI and remainder of comprehensive ROS otherwise negative.   PHYSICAL EXAM  Patient Vitals for the past 24 hrs:  BP Temp Temp src Pulse Resp SpO2 Height Weight  12/02/23 1559 113/61 -- -- 64 16 -- -- --  12/02/23 1405 -- -- -- -- -- 100 % -- --  12/02/23 1318 114/60 99.2 F (37.3 C) Oral 81  18 100 % 5' 1 (1.549 m) 70.9 kg    Physical Exam Vitals and nursing note reviewed.  Constitutional:      General: She is not in acute distress.    Appearance: Normal appearance. She is well-developed. She is not ill-appearing.  HENT:     Head: Normocephalic.  Cardiovascular:     Rate and Rhythm: Normal rate.     Pulses: Normal pulses.  Pulmonary:     Effort: Pulmonary effort is normal.  Abdominal:     Comments: Gravid  Skin:    General: Skin is warm and dry.     Capillary Refill: Capillary refill takes less than 2 seconds.  Neurological:     General: No focal deficit present.     Mental Status: She is alert and oriented to person, place, and time.  Psychiatric:        Mood and Affect: Mood normal.        Behavior: Behavior normal.        Thought Content: Thought content normal.        Judgment: Judgment normal.      Dilation: 1 Effacement (%): Thick Station:  Ballotable Exam by:: Camie [place] Unchanged in >2 hour  Fetal Tracing: Baseline: 120 Variability: moderate Accelerations: 15x15 Decelerations:absent Toco: UC present   Labs: Results for orders placed or performed during the hospital encounter of 12/02/23 (from the past 24 hours)  Wet prep, genital     Status: Abnormal   Collection Time: 12/02/23  1:30 PM   Specimen: PATH Cytology Cervicovaginal Ancillary Only  Result Value Ref Range   Yeast Wet Prep HPF POC NONE SEEN NONE SEEN   Trich, Wet Prep NONE SEEN NONE SEEN   Clue Cells Wet Prep HPF POC PRESENT (A) NONE SEEN   WBC, Wet Prep HPF POC >=10 (A) <10   Sperm NONE SEEN   Rupture of Membrane (ROM) Plus     Status: None   Collection Time: 12/02/23  1:30 PM  Result Value Ref Range   Rom Plus NEGATIVE   Urinalysis, Routine w reflex microscopic -Urine, Clean Catch     Status: Abnormal   Collection Time: 12/02/23  1:34 PM  Result Value Ref Range   Color, Urine YELLOW YELLOW   APPearance CLEAR CLEAR   Specific Gravity, Urine 1.010 1.005 - 1.030   pH  7.0 5.0 - 8.0   Glucose, UA NEGATIVE NEGATIVE mg/dL   Hgb urine dipstick NEGATIVE NEGATIVE   Bilirubin Urine NEGATIVE NEGATIVE   Ketones, ur 20 (A) NEGATIVE mg/dL   Protein, ur NEGATIVE NEGATIVE mg/dL   Nitrite NEGATIVE NEGATIVE   Leukocytes,Ua NEGATIVE NEGATIVE  Protein / creatinine ratio, urine     Status: None   Collection Time: 12/02/23  1:34 PM  Result Value Ref Range   Creatinine, Urine 69 mg/dL   Total Protein, Urine <6 mg/dL   Protein Creatinine Ratio        0.00 - 0.15 mg/mg[Cre]  Comprehensive metabolic panel     Status: Abnormal   Collection Time: 12/02/23  1:45 PM  Result Value Ref Range   Sodium 136 135 - 145 mmol/L   Potassium 4.2 3.5 - 5.1 mmol/L   Chloride 104 98 - 111 mmol/L   CO2 23 22 - 32 mmol/L   Glucose, Bld 74 70 - 99 mg/dL   BUN 6 6 - 20 mg/dL   Creatinine, Ser 9.41 0.44 - 1.00 mg/dL   Calcium 9.0 8.9 - 89.6 mg/dL   Total Protein 6.6 6.5 - 8.1 g/dL   Albumin 3.0 (L) 3.5 - 5.0 g/dL   AST 23 15 - 41 U/L   ALT 13 0 - 44 U/L   Alkaline Phosphatase 119 38 - 126 U/L   Total Bilirubin 0.5 0.0 - 1.2 mg/dL   GFR, Estimated >39 >39 mL/min   Anion gap 9 5 - 15  CBC with Differential/Platelet     Status: Abnormal   Collection Time: 12/02/23  1:45 PM  Result Value Ref Range   WBC 11.6 (H) 4.0 - 10.5 K/uL   RBC 4.21 3.87 - 5.11 MIL/uL   Hemoglobin 12.1 12.0 - 15.0 g/dL   HCT 63.2 63.9 - 53.9 %   MCV 87.2 80.0 - 100.0 fL   MCH 28.7 26.0 - 34.0 pg   MCHC 33.0 30.0 - 36.0 g/dL   RDW 87.0 88.4 - 84.4 %   Platelets 235 150 - 400 K/uL   nRBC 0.0 0.0 - 0.2 %   Neutrophils Relative % 68 %   Neutro Abs 8.1 (H) 1.7 - 7.7 K/uL   Lymphocytes Relative 23 %   Lymphs Abs 2.6 0.7 - 4.0 K/uL  Monocytes Relative 6 %   Monocytes Absolute 0.7 0.1 - 1.0 K/uL   Eosinophils Relative 1 %   Eosinophils Absolute 0.1 0.0 - 0.5 K/uL   Basophils Relative 1 %   Basophils Absolute 0.1 0.0 - 0.1 K/uL   Immature Granulocytes 1 %   Abs Immature Granulocytes 0.06 0.00 - 0.07 K/uL     Imaging:  No results found.  MDM & MAU COURSE  MDM:  Moderate Review of EMR, PE, History Rule out PTL or ROM Fern and ROM negative. Negative for pooling. BV present on wet prep  MAU Course: Orders Placed This Encounter  Procedures   Wet prep, genital   Urinalysis, Routine w reflex microscopic -Urine, Clean Catch   Comprehensive metabolic panel   CBC with Differential/Platelet   Protein / creatinine ratio, urine   Rupture of Membrane (ROM) Plus   Fern Test   Discharge patient   Meds ordered this encounter  Medications   cyclobenzaprine  (FLEXERIL ) tablet 10 mg   cyclobenzaprine  (FLEXERIL ) 10 MG tablet    Sig: Take 0.5 tablets (5 mg total) by mouth 3 (three) times daily as needed for muscle spasms.    Dispense:  30 tablet    Refill:  0    Supervising Provider:   ZINA SATTERFIELD A [[id number]]   metroNIDAZOLE  (METROGEL ) 0.75 % vaginal gel    Sig: Place 1 Applicatorful vaginally at bedtime. Apply one applicatorful to vagina at bedtime for 5 days    Dispense:  70 g    Refill:  1    Supervising Provider:   ZINA SATTERFIELD A [[id number]]    ASSESSMENT   1. Bacterial vaginosis   2. Intact amniotic membranes during pregnancy in third trimester   3. False labor   4. [redacted] weeks gestation of pregnancy   5. Movement of fetus present during pregnancy in third trimester   6. NST (non-stress test) reactive     PLAN  Discharge home in stable condition.  Routine precautions advised, with addition of presenting to delivering hospital for future concerns if possible.   - Metrogel  0.75% 70g sent for PV use nightly for 5 nights for BV - Flexeril  5mg  TID sent for musculoskeletal pain  Allergies as of 12/02/2023   No Known Allergies      Medication List     STOP taking these medications    Doxylamine -Pyridoxine  10-10 MG Tbec Commonly known as: Diclegis    ferrous sulfate  325 (65 FE) MG tablet       TAKE these medications    cyclobenzaprine  10 MG tablet Commonly known as:  FLEXERIL  Take 0.5 tablets (5 mg total) by mouth 3 (three) times daily as needed for muscle spasms.   metroNIDAZOLE  0.75 % vaginal gel Commonly known as: METROGEL  Place 1 Applicatorful vaginally at bedtime. Apply one applicatorful to vagina at bedtime for 5 days   PRENATAL VITAMIN PO Take 1 tablet by mouth daily.        Camie Rote, MSN, CNM 12/02/2023 5:37 PM  Certified Nurse Midwife, [facility]

## 2023-12-03 LAB — GC/CHLAMYDIA PROBE AMP (~~LOC~~) NOT AT ARMC
Chlamydia: NEGATIVE
Comment: NEGATIVE
Comment: NORMAL
Neisseria Gonorrhea: NEGATIVE

## 2023-12-05 DIAGNOSIS — O4703 False labor before 37 completed weeks of gestation, third trimester: Secondary | ICD-10-CM | POA: Diagnosis not present

## 2023-12-05 DIAGNOSIS — Z3A33 33 weeks gestation of pregnancy: Secondary | ICD-10-CM | POA: Diagnosis not present

## 2023-12-05 DIAGNOSIS — O35FXX0 Maternal care for other (suspected) fetal abnormality and damage, fetal musculoskeletal anomalies of trunk, not applicable or unspecified: Secondary | ICD-10-CM | POA: Diagnosis not present

## 2023-12-05 NOTE — Progress Notes (Signed)
 [facility]  Maternal Fetal Medicine Follow Up Consultation Note   Date of Consult:  12/05/2023   Consulting Attending: No att. providers found    Consulting Provider:  BARBIE SIMONE PRINGLE   Referring Provider:   PRINGLE BARBIE SIMONE, MD [address] [place],  [place] [address]    Reason for Consultation: follow up consultation re: delivery planning in the setting of [facility]   Subjective:   Andrea Jacobson is a 23 y.o. female G1P0 at [redacted]w[redacted]d.   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  She is currently 33 weeks and 4 days pregnant with her first child, and her pregnancy is complicated by the diagnosis of a congenital diaphragmatic hernia [facility]) in the fetus. She has been closely monitored throughout her pregnancy due to this condition. She has previously consulted with specialists at the [facility] [place] ([facility] regarding potential in utero surgical procedures, but it was determined that she did not meet the criteria for such interventions.  She is seeking guidance on the best location for delivery. She was advised by the [facility] team that if she would like to deliver at their center she will need documentation of medical necessity from a [place] provider.  She has reached out to her Medicaid provider and her local OB GYN in [place]  to understand the steps needed to facilitate delivery at [facility]. She was informed that a letter from her current care team in [place] would need to provide documentation of medical need to support this request, specifically stating the medical necessity for delivery at [facility].  Recently, she experienced pain in her upper abdomen and cramping in the lower part, prompting a visit to the hospital where she was found to be having contractions and was 1 cm dilated. She  is monitoring for contractions and will seek medical attention if she recurs.  She is currently scheduled for multiple upcoming appointments, including a return visit to [facility] and consultations at [facility]. She is also considering the possibility of delivering at [facility] if it provides a more comfortable option for her and her baby.  No leakage of fluid or bleeding.    Past Ob History:   OB History  Gravida Para Term Preterm AB Living  1 0 0 0 0 0  SAB IAB Ectopic Molar Multiple Live Births  0 0 0 0 0 0    # Outcome Date GA Lbr Len/2nd Weight Sex Type Anes PTL Lv  1 Current              Past Gyn History:   Menstrual History: OB History     Gravida  1   Para      Term      Preterm      AB      Living         SAB      IAB      Ectopic      Molar      Multiple      Live Births            Patient's last menstrual period was 03/27/2023 (approximate).      Past Medical History:  Diagnosis Date  . HSV-2 (herpes simplex virus 2) infection    S/p valcyclovir early in pregnancy  . PCOS (polycystic ovarian syndrome)   .  Seizures (CMS/HHS-HCC)    Seizures x2 2020-12-21 and 2022-12-22) > No medications, no Neurology follow up     Past Surgical History:  Procedure Laterality Date  . Left toe procedure    . Wisdom tooth extraction       Family History  Problem Relation Name Age of Onset  . No Known Problems Mother    . No Known Problems Father       Social History:  Social History   Tobacco Use  . Smoking status: Never  . Smokeless tobacco: Never  Substance Use Topics  . Alcohol use: Never      Allergies: No Known Allergies    Medications:   No outpatient medications have been marked as taking for the 12/05/23 encounter (Telemedicine) with Gerard Barbie Catholic, MD.    Objective:   Vital signs and Physical exam not performed as this was a telehealth visit.   Assessment/Recommendations:   Andrea Jacobson is a 23 y.o. G1P0 at [redacted]w[redacted]d presenting for  consultation regarding the following issues:  Assessment & Plan Fetal congenital diaphragmatic hernia Fetus has a congenital diaphragmatic hernia with significant risks. [facility] evaluation found fetus ineligible for in utero surgery. She is concerned and considering alternative care locations. - Facilitate meeting with [facility] for second opinion and potential transfer. - Continue care at [facility] while exploring [facility] options. - Maintain appointments at [facility] and [facility]. - Contact [facility] for potential transfer and meeting.  Preterm contractions Preterm contractions evaluated with 1 cm cervical dilation. Active labor ruled out at local hospital. Monitoring is crucial due to fetal condition. - Advise monitoring for contractions and seek evaluation if she recurs.   - Plan ultrasound around August 13th to monitor fetal growth and amniotic fluid. - Coordinate to combine ultrasound with scheduled consult to minimize travel.    Total time spent with the patient was 30 minutes with greater than 50% spent in counseling and coordination of care.   Thank you for the courtesy of this kind consultation. We appreciate this interesting consult and will be happy to be involved in the ongoing care of Ms. Munshi in anyway her obstetricians desire.   This note has been created using automated tools and reviewed for accuracy by [facility] MALENA WHEELER.  Attestation Statement:   I personally performed the service. (TP)  SARAHN MALENA WHEELER, MD   This video encounter was conducted with the patient's (or proxy's) verbal consent via secure, interactive audio and video telecommunications while in clinic/office/hospital.   The patient (or proxy) was instructed to have this encounter in a suitably private space and to only have persons present to whom they give permission to participate. In addition, patient identity was confirmed by use of name plus an additional identifier.   12/22/19 E&M) This visit was coded based on time.  I spent a total of 30 minutes in both face-to-face and non-face-to-face activities for this visit on the date of this encounter.
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
## 2023-12-09 NOTE — Progress Notes (Signed)
 Reached out to patient to set up Neonatal consult.  Patient stated that she had heard from [facility] this morning that Medicaid had approved her delivery at [facility] and she is making plans to relocate to that area as of next Monday, 8/11.  I asked about canceling her consult on 8/7 and she stated that she no longer needed it.  I will send a message to the team to cancel the appointment.

## 2023-12-10 NOTE — Progress Notes (Signed)
 [facility]  [facility]       SOCIAL WORK PROGRESS RECORD      PATIENT NAME: Andrea Jacobson MRN#: [id number] DOB:  [birth year] TODAY'S DATE:  12/10/2023 TODAY'S TIME:  3:14 PM EDT  Problems:  Housing and Transportation  Progress: Improved  Plan: SW booked flight for pt and her partner on 8/11 from [place] to [place] departing at 6:27p on [employer] and landing at 8p. Pt will stay at [facility] until her room at [facility] is ready.   Time Spent: 90 minutes The time noted for this encounter is inclusive of, but not limited to: chart review, multidisciplinary team communication and collaboration, direct patient and/or family care, travel, resource exploration, interagency coordination and documentation  Acuity: Level 3   Electronically signed: Rollene Jacobson  12/10/2023 3:14 PM EDT

## 2023-12-11 NOTE — Progress Notes (Signed)
 Received fax transmission. Patient declined [facility] appt. Going to [facility] next week.  DEVERE SHAUNNA POUCH, RN

## 2023-12-13 ENCOUNTER — Encounter: Payer: Self-pay | Admitting: Physician Assistant

## 2023-12-13 ENCOUNTER — Ambulatory Visit: Admitting: Obstetrics and Gynecology

## 2023-12-13 VITALS — BP 108/68 | HR 63 | Wt 160.8 lb

## 2023-12-13 DIAGNOSIS — Z3A34 34 weeks gestation of pregnancy: Secondary | ICD-10-CM

## 2023-12-13 DIAGNOSIS — O35FXX0 Maternal care for other (suspected) fetal abnormality and damage, fetal musculoskeletal anomalies of trunk, not applicable or unspecified: Secondary | ICD-10-CM

## 2023-12-13 DIAGNOSIS — Z3480 Encounter for supervision of other normal pregnancy, unspecified trimester: Secondary | ICD-10-CM

## 2023-12-13 DIAGNOSIS — N760 Acute vaginitis: Secondary | ICD-10-CM

## 2023-12-13 MED ORDER — FLUCONAZOLE 150 MG PO TABS: 150.0000 mg | ORAL_TABLET | Freq: Once | ORAL | 0 refills | Status: DC

## 2023-12-13 MED ORDER — CLOTRIMAZOLE 1 % VA CREA: 1.0000 | TOPICAL_CREAM | Freq: Every day | VAGINAL | 0 refills | Status: AC

## 2023-12-13 NOTE — Progress Notes (Signed)
 Pt presents for ROB visit. Pt c/o white clumpy discharge after taking Flagyl .

## 2023-12-13 NOTE — Progress Notes (Signed)
   PRENATAL VISIT NOTE  Subjective:  Andrea Jacobson is a 23 y.o. G1P0000 at [redacted]w[redacted]d being seen today for ongoing prenatal care.  She is currently monitored for the following issues for this low-risk pregnancy and has PCOS (polycystic ovarian syndrome); Irregular periods; Vasovagal syncope; Hirsutism; Weight gain; Herpes simplex vulvovaginitis; Supervision of other normal pregnancy, antepartum; and Diaphragmatic hernia of fetus affecting antepartum care of mother on their problem list.  Patient reports vaginal irritation.  Contractions: Not present. Vag. Bleeding: None.  Movement: Present. Denies leaking of fluid.   White thick clumpy discharge after completing metrogel . No foul odor or itching. Has not had symptoms like this before.   Recent contractions/cramping - told due to amniotic fluid, declined reduction   The following portions of the patient's history were reviewed and updated as appropriate: allergies, current medications, past family history, past medical history, past social history, past surgical history and problem list.   Objective:    Vitals:   12/13/23 1127  BP: 108/68  Pulse: 63  Weight: 160 lb 12.8 oz (72.9 kg)    Fetal Status:  Fetal Heart Rate (bpm): 138   Movement: Present    General: Alert, oriented and cooperative. Patient is in no acute distress.  Skin: Skin is warm and dry. No rash noted.   Cardiovascular: Normal heart rate noted  Respiratory: Normal respiratory effort, no problems with respiration noted  Abdomen: Soft, gravid, appropriate for gestational age.  Pain/Pressure: Present     Pelvic: Cervical exam deferred        Extremities: Normal range of motion.  Edema: None  Mental Status: Normal mood and affect. Normal behavior. Normal judgment and thought content.   Assessment and Plan:  Pregnancy: G1P0000 at [redacted]w[redacted]d 1. Supervision of other normal pregnancy, antepartum (Primary) Cramping/contractions improving Recent MAU visit for   2. Acute  vaginitis Discussed vaginal treatment for presumed vaginal candida following treatment for BV. Diflucan  sent when it was noted that it appears vaginal clotrimazole  may not be covered, but ultimately re-sent vaginal treatment to pharmacy on file - fluconazole  (DIFLUCAN ) 150 MG tablet; Take 1 tablet (150 mg total) by mouth once for 1 dose.  Dispense: 1 tablet; Refill: 0  3. [redacted] weeks gestation of pregnancy   4. Diaphragmatic hernia of fetus affecting antepartum care of mother, single or unspecified fetus Transferring to [facility] for delivery   Preterm labor symptoms and general obstetric precautions including but not limited to vaginal bleeding, contractions, leaking of fluid and fetal movement were reviewed in detail with the patient. Please refer to After Visit Summary for other counseling recommendations.   No follow-ups on file.  Future Appointments  Date Time Provider Department Center  05/18/2024  7:30 AM Shamleffer, Donell Cardinal, MD [id number] None    Carter Quarry, MD

## 2024-04-21 DIAGNOSIS — R1013 Epigastric pain: Secondary | ICD-10-CM | POA: Diagnosis not present

## 2024-04-21 DIAGNOSIS — Q790 Congenital diaphragmatic hernia: Secondary | ICD-10-CM | POA: Diagnosis not present

## 2024-05-18 ENCOUNTER — Ambulatory Visit: Payer: Medicaid Other | Admitting: Internal Medicine

## 2024-05-18 ENCOUNTER — Other Ambulatory Visit

## 2024-05-18 VITALS — BP 116/78 | HR 63 | Ht 61.0 in | Wt 147.0 lb

## 2024-05-18 DIAGNOSIS — L680 Hirsutism: Secondary | ICD-10-CM | POA: Diagnosis not present

## 2024-05-18 DIAGNOSIS — E785 Hyperlipidemia, unspecified: Secondary | ICD-10-CM | POA: Diagnosis not present

## 2024-05-18 DIAGNOSIS — E282 Polycystic ovarian syndrome: Secondary | ICD-10-CM

## 2024-05-18 NOTE — Progress Notes (Unsigned)
 "   Name: Andrea Jacobson  MRN/ DOB: [id number], 2000/08/07    Age/ Sex: 24 y.o., female    PCP: Purcell Emil Schanz, MD   Reason for Endocrinology Evaluation: PCOS     Date of Initial Endocrinology Evaluation: 05/15/2022    HPI: Andrea Jacobson is a 24 y.o. female with a past medical history of PCOS. The patient presented for initial endocrinology clinic visit on 05/15/2022 for consultative assistance with her PCOS.   Patient presented to PCP in February 2023 for evaluation of hirsutism and concerns regarding PCOS  Historically she has had normal testosterone  level at 26.5 NG/DL in February 2023, these results were confirmed again in March 2023.  She has had normal A1c at 5.4% 06/2021 and normal TFTs  Patient presented to the ED on 04/24/2022 with near syncope, she was noted with low potassium of 2.9 mmol/L, low calcium at 8.1 mg/dL    The patient has menstrual irregularities such as oligomenorrhea or amenorrhea. She started menarche around age 35 y.o.   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 She does not  have family history of PCOS, or, ovarian cancer.     She was on OCP's from 2019 until 2022. She did not like the effects of it.   Her labs 05/2022 showed A1c 5.4%, normal DHEA-S 223 mcg/DL, normal 17 hydroxyprogesterone 42 NG/DL, normal estradiol , FSH, LH, cortisol and ACTH , as well as normal testosterone  37 NG/DL, normal TFTs and prolactin   Saliva cortisol x 2 normal 05/2022   Patient declined COC's due to skepticism about side effects, and opted for monthly progesterone   SUBJECTIVE:    Today (05/19/2024):  Andrea Jacobson is here for follow-up on  PCOS.   Since her last visit here she had a C-section 01/07/2024- baby girl . Baby had to undergo diaphragmatic hernia sx, recovered well  She stopped breast feeding a week ago  LMP a week ago , menstruations became consistent in 03/2024 She was prescribed norethindrone through Ob in [place]  She had to stop laser hair removal , she was getting a full body laser hair remova She will start working next week  No palpitations  No tremors     HISTORY:  Past Medical History:  Past Medical History:  Diagnosis Date   HSV-2 seropositive    PCOS (polycystic ovarian syndrome)    Secondary amenorrhea 08/21/2022   Seizures (HCC)    Past Surgical History:  Past Surgical History:  Procedure Laterality Date   NO PAST SURGERIES      Social History:  reports that she has never smoked. She has never used smokeless tobacco. She reports that she does not currently use alcohol. She reports that she does not use drugs. Family History: family history includes Asthma in her brother; Cancer in her maternal aunt; Diabetes in her maternal aunt; Healthy in her father and mother.   HOME MEDICATIONS: Allergies as of 05/18/2024   No Known Allergies      Medication List        Accurate as of May 18, 2024 11:59 PM. If you have any questions, ask your nurse or doctor.          STOP taking  these medications    cyclobenzaprine  10 MG tablet Commonly known as: FLEXERIL  Stopped by: Donell Butts, MD   PRENATAL VITAMIN PO Stopped by: Donell Butts, MD       TAKE these medications    metroNIDAZOLE  0.75 % vaginal gel Commonly known as: METROGEL  Place 1 Applicatorful vaginally at bedtime. Apply one applicatorful to vagina at bedtime for 5 days          REVIEW OF SYSTEMS: A comprehensive ROS was conducted with the patient and is negative except as per HPI    OBJECTIVE:  VS: BP 116/78   Pulse 63   Ht 5' 1 (1.549 m)   Wt 147 lb (66.7 kg)   LMP 05/11/2024   SpO2 99%    Breastfeeding No   BMI 27.78 kg/m    Wt Readings from Last 3 Encounters:  05/18/24 147 lb (66.7 kg)  12/13/23 160 lb 12.8 oz (72.9 kg)  12/02/23 156 lb 4.8 oz (70.9 kg)     EXAM: General: Pt appears well and is in NAD  Neck: General: Supple without adenopathy. Thyroid : Thyroid  size normal.  No goiter or nodules appreciated.  Lungs: Clear with good BS bilat   Heart: Auscultation: RRR.  Abdomen: Soft, nontender  Extremities:  BL LE: No pretibial edema normal   Mental Status: Judgment, insight: Intact Orientation: Oriented to time, place, and person Mood and affect: No depression, anxiety, or agitation     DATA REVIEWED:    Latest Reference Range & Units 05/18/24 08:04  Sodium 135 - 146 mmol/L 141  Potassium 3.5 - 5.3 mmol/L 4.2  Chloride 98 - 110 mmol/L 105  CO2 20 - 32 mmol/L 30  Glucose 65 - 139 mg/dL 80  Mean Plasma Glucose mg/dL 894  BUN 7 - 25 mg/dL 16  Creatinine 9.49 - 9.03 mg/dL 9.35  Calcium 8.6 - 89.7 mg/dL 9.8  BUN/Creatinine Ratio 6 - 22 (calc) SEE NOTE:  eGFR > OR = 60 mL/min/1.102m2 129  Total CHOL/HDL Ratio <5.0 (calc) 3.7  Cholesterol <200 mg/dL 827  HDL Cholesterol > OR = 50 mg/dL 46 (L)  LDL Cholesterol (Calc) mg/dL (calc) 99  Non-HDL Cholesterol (Calc) <130 mg/dL (calc) 873  Triglycerides <150 mg/dL 827 (H)      Pelvic ultrasound 07/05/2021 Narrative & Impression  CLINICAL DATA:  Evaluate ovaries, history of PCOS.  NO TRANSVAGINAL   EXAM: TRANSABDOMINAL ULTRASOUND OF PELVIS   TECHNIQUE: Transabdominal ultrasound examination of the pelvis was performed including evaluation of the uterus, ovaries, adnexal regions, and pelvic cul-de-sac.   COMPARISON:  None.   FINDINGS: Uterus   Measurements: 7.2 x 4.3 x 5.7 cm = volume: 94 mL. No fibroids or other mass visualized.   Endometrium   Thickness: 9 mm.  No focal abnormality visualized.   Right ovary   Not visualized transabdominally secondary to shadowing bowel gas.   Left ovary    Not visualized transabdominally secondary to shadowing bowel gas.   Other findings:  No abnormal free fluid.   IMPRESSION: Ovaries are not visualized transabdominally secondary to shadowing bowel gas.      ASSESSMENT/PLAN/RECOMMENDATIONS:   PCOS:  The management of PCOS involves amelioration of hyperandrogenic symptoms, metabolic abnormalities , prevention of endometrial hyperplasia and carcinoma as well as contraception for those not pursuing pregnancy.  - She would like to avoid COC's due to skepticism about side effects  -Saliva cortisol and 17 hydroxyprogesterone were normal 05/2022 - The patient was having regular menstruation on progesterone , but since her delivery she is  interested in contraception methods.  Patient was advised to discuss this with gynecology, as she may want to consider IUD, so far her menstruations have been regular since November, 2025  2. Hirsutism   -Patient is not interested in combined COC's which is the first-line of treatment for hirsutism -Unable to proceed with spironolactone as long as she is not on COC's due to teratogenicity -DHEA-S , testosterone , 17 hydroxyprogesterone and salivary cortisol are normal - She was using laser hair removal therapy, but this has been on hold since pregnancy and delivery   3.Dyslipidemia :  - Triglycerides slightly elevated, LDL at goal - Will encourage lifestyle changes   Recommend continuation of current Tx with primary MD and consultative f/u at [facility] in the future if pt's DM control becomes problematic.   Signed electronically by: Stefano Redgie Butts, MD  [facility]  [facility] [address] [place] [place], [place] [address] Phone: [phone] FAX: [phone]   CC: Purcell Emil Schanz, MD [address] [place] [address] Phone: [phone] Fax: [phone]   Return to Endocrinology clinic as below: No future  appointments.         "

## 2024-05-18 NOTE — Patient Instructions (Addendum)
 Please follow up with Gynecology for birth control options Please follow-up with your primary care physician for continued follow-up on cholesterol, sugar, etc

## 2024-05-19 ENCOUNTER — Ambulatory Visit: Payer: Self-pay | Admitting: Internal Medicine

## 2024-05-19 LAB — BASIC METABOLIC PANEL WITH GFR
BUN: 16 mg/dL (ref 7–25)
CO2: 30 mmol/L (ref 20–32)
Calcium: 9.8 mg/dL (ref 8.6–10.2)
Chloride: 105 mmol/L (ref 98–110)
Creat: 0.64 mg/dL (ref 0.50–0.96)
Glucose, Bld: 80 mg/dL (ref 65–139)
Potassium: 4.2 mmol/L (ref 3.5–5.3)
Sodium: 141 mmol/L (ref 135–146)
eGFR: 129 mL/min/1.73m2

## 2024-05-19 LAB — HEMOGLOBIN A1C
Hgb A1c MFr Bld: 5.3 %
Mean Plasma Glucose: 105 mg/dL
eAG (mmol/L): 5.8 mmol/L

## 2024-05-19 LAB — TSH: TSH: 2.4 m[IU]/L

## 2024-05-19 LAB — T4, FREE: Free T4: 1.3 ng/dL (ref 0.8–1.8)

## 2024-05-19 LAB — LIPID PANEL
Cholesterol: 172 mg/dL
HDL: 46 mg/dL — ABNORMAL LOW
LDL Cholesterol (Calc): 99 mg/dL
Non-HDL Cholesterol (Calc): 126 mg/dL
Total CHOL/HDL Ratio: 3.7 (calc)
Triglycerides: 172 mg/dL — ABNORMAL HIGH

## 2024-05-20 ENCOUNTER — Encounter: Payer: Self-pay | Admitting: Emergency Medicine

## 2024-05-20 ENCOUNTER — Ambulatory Visit: Admitting: Emergency Medicine

## 2024-05-20 VITALS — BP 124/82 | HR 56 | Temp 97.9°F | Ht 61.0 in | Wt 146.0 lb

## 2024-05-20 DIAGNOSIS — R1013 Epigastric pain: Secondary | ICD-10-CM

## 2024-05-20 LAB — COMPREHENSIVE METABOLIC PANEL WITH GFR
ALT: 17 U/L (ref 3–35)
AST: 15 U/L (ref 5–37)
Albumin: 4.6 g/dL (ref 3.5–5.2)
Alkaline Phosphatase: 109 U/L (ref 39–117)
BUN: 14 mg/dL (ref 6–23)
CO2: 29 meq/L (ref 19–32)
Calcium: 9.6 mg/dL (ref 8.4–10.5)
Chloride: 104 meq/L (ref 96–112)
Creatinine, Ser: 0.61 mg/dL (ref 0.40–1.20)
GFR: 127.93 mL/min
Glucose, Bld: 85 mg/dL (ref 70–99)
Potassium: 3.8 meq/L (ref 3.5–5.1)
Sodium: 140 meq/L (ref 135–145)
Total Bilirubin: 0.5 mg/dL (ref 0.2–1.2)
Total Protein: 7.5 g/dL (ref 6.0–8.3)

## 2024-05-20 LAB — CBC WITH DIFFERENTIAL/PLATELET
Basophils Absolute: 0.1 K/uL (ref 0.0–0.1)
Basophils Relative: 0.9 % (ref 0.0–3.0)
Eosinophils Absolute: 0.1 K/uL (ref 0.0–0.7)
Eosinophils Relative: 1.4 % (ref 0.0–5.0)
HCT: 38.7 % (ref 36.0–46.0)
Hemoglobin: 12.9 g/dL (ref 12.0–15.0)
Lymphocytes Relative: 33.8 % (ref 12.0–46.0)
Lymphs Abs: 2.7 K/uL (ref 0.7–4.0)
MCHC: 33.3 g/dL (ref 30.0–36.0)
MCV: 82.6 fl (ref 78.0–100.0)
Monocytes Absolute: 0.5 K/uL (ref 0.1–1.0)
Monocytes Relative: 6.5 % (ref 3.0–12.0)
Neutro Abs: 4.6 K/uL (ref 1.4–7.7)
Neutrophils Relative %: 57.4 % (ref 43.0–77.0)
Platelets: 232 K/uL (ref 150.0–400.0)
RBC: 4.68 Mil/uL (ref 3.87–5.11)
RDW: 13.7 % (ref 11.5–15.5)
WBC: 8 K/uL (ref 4.0–10.5)

## 2024-05-20 LAB — LIPASE: Lipase: 12 U/L (ref 11.0–59.0)

## 2024-05-20 MED ORDER — PANTOPRAZOLE SODIUM 40 MG PO TBEC: 40.0000 mg | DELAYED_RELEASE_TABLET | Freq: Every day | ORAL | 3 refills | Status: AC

## 2024-05-20 NOTE — Assessment & Plan Note (Signed)
 Clinically stable.  No red flag signs or symptoms. Benign abdominal examination. Diet and nutrition discussed Advised to stay well-hydrated Recommend trial of pantoprazole  40 mg daily for the next 3 to 4 weeks If symptoms persist recommend GI evaluation and possible upper endoscopy. ED precautions given Advised to contact the office if no better or worse during the next several days

## 2024-05-20 NOTE — Patient Instructions (Signed)
 Inflammation of the Stomach in Adults (Gastritis): What to Know Gastritis is when the lining of your stomach gets red and swollen (inflammation). There're two kinds of gastritis. There's gastritis that happens quickly. This is called acute gastritis. There's gastritis that happens over a long time. This is called chronic gastritis. Gastritis must be treated. If not, you can have sores or bleeding in your stomach. What are the causes? Germs that get to your stomach and cause an infection. Drinking too much alcohol. Taking some medicines. Too much acid in the stomach. Disease of the stomach. Allergies. Cancer treatments (radiation). Smoking cigarettes or using products that contain nicotine or tobacco. What increases the risk? Having a disease of the intestines. Having an autoimmune disease, such as Crohn's disease. This is when your immune system attacks other organs in the body. Using aspirin, ibuprofen, and other NSAIDs to treat other conditions. Stress. What are the signs or symptoms? Pain or burning in your belly. Feeling like you may throw up. Throwing up. Feeling too full after you eat. Losing weight. Bad breath. Blood in your throw-up or poop. In some cases, there are no symptoms. How is this treated? Gastritis is treated with medicines. The medicines that are used depend on what caused the condition. If the condition was caused by germs (bacteria), you'll be given antibiotics. If the condition was caused by too much acid in the stomach, you'll be given antacids, H2 blockers, or proton pump inhibitors. You may also be told to stop taking certain medicines, such as aspirin, ibuprofen, and other NSAIDs. Follow these instructions at home: Medicines Take your medicines only as told. Take your antibiotics as told. Do not stop taking them even if you start to feel better. Alcohol use Do not drink alcohol if: Your doctor tells you not to drink. You are pregnant, may be  pregnant, or are planning to become pregnant. If you drink alcohol: Limit how much you have to: 0-1 drink a day if you're female. 0-2 drinks a day if you're female. Know how much alcohol is in your drink. In the [place] one drink is one 12 oz bottle of beer (355 mL), one 5 oz glass of wine (148 mL), or one 1 oz glass of hard liquor (44 mL). General instructions Eat small meals often, instead of large meals. Avoid foods and drinks that make your symptoms worse. Drink more fluid as told. Do not smoke, vape, or use nicotine or tobacco. Talk with your doctor about ways to manage stress. You may be told to: Get regular exercise. Do deep breathing. Do meditation or yoga. Contact a doctor if: Your symptoms get worse. The pain in your belly gets worse. Your symptoms go away and then come back. You have a fever. Get help right away if: You throw up blood or a substance that looks like coffee grounds. You have black or dark red poop. You throw up every time you drink something. These symptoms may be an emergency. Call 911 right away. Do not wait to see if the symptoms will go away. Do not drive yourself to the hospital. This information is not intended to replace advice given to you by your health care provider. Make sure you discuss any questions you have with your health care provider. Document Revised: 06/25/2023 Document Reviewed: 06/25/2023 Elsevier Patient Education  2025 [employer].

## 2024-05-20 NOTE — Progress Notes (Signed)
 Andrea Jacobson 24 y.o.   Chief Complaint  Patient presents with   Abdominal Pain    Abdominal pain radiating to chest and lower back this has been happening since October     HISTORY OF PRESENT ILLNESS: This is a 24 y.o. female complaining of epigastric burning pain with some radiation up into the chest and sometimes to her back.  Intermittent episodes for the past couple of months. No other associated symptoms.  Denies melena or rectal bleeding. Denies overuse of NSAIDs.  Stress level is low at present time. No changes in her diet but has noticed that greasy or spicy food brings on symptoms. No other complaints or medical concerns today.  Abdominal Pain Pertinent negatives include no dysuria, fever, headaches, hematuria, rash or sore throat.     Prior to Admission medications  Medication Sig Start Date End Date Taking? Authorizing Provider  metroNIDAZOLE  (METROGEL ) 0.75 % vaginal gel Place 1 Applicatorful vaginally at bedtime. Apply one applicatorful to vagina at bedtime for 5 days Patient not taking: Reported on 12/13/2023 12/02/23   Regino Camie LABOR, CNM    Allergies[1]  Patient Active Problem List   Diagnosis Date Noted   Diaphragmatic hernia of fetus affecting antepartum care of mother 08/26/2023   Supervision of other normal pregnancy, antepartum 06/03/2023   Herpes simplex vulvovaginitis 04/02/2023   Hirsutism 11/14/2022   Weight gain 11/14/2022   Vasovagal syncope 08/21/2022   Irregular periods 11/03/2021   PCOS (polycystic ovarian syndrome) 07/03/2021    Past Medical History:  Diagnosis Date   HSV-2 seropositive    PCOS (polycystic ovarian syndrome)    Secondary amenorrhea 08/21/2022   Seizures (HCC)     Past Surgical History:  Procedure Laterality Date   NO PAST SURGERIES      Social History   Socioeconomic History   Marital status: Single    Spouse name: Not on file   Number of children: Not on file   Years of education: Not on file   Highest  education level: 12th grade  Occupational History   Not on file  Tobacco Use   Smoking status: Never   Smokeless tobacco: Never  Vaping Use   Vaping status: Never Used  Substance and Sexual Activity   Alcohol use: Not Currently   Drug use: Never   Sexual activity: Yes    Partners: Male    Birth control/protection: Condom  Other Topics Concern   Not on file  Social History Narrative   Lives at home with mom and brother   Right handed   [redacted] weeks pregnant today   No caffeine   Social Drivers of Health   Tobacco Use: Low Risk (05/20/2024)   Patient History    Smoking Tobacco Use: Never    Smokeless Tobacco Use: Never    Passive Exposure: Not on file  Financial Resource Strain: Low Risk (05/19/2024)   Overall Financial Resource Strain (CARDIA)    Difficulty of Paying Living Expenses: Not hard at all  Food Insecurity: No Food Insecurity (05/19/2024)   Epic    Worried About Programme Researcher, Broadcasting/film/video in the Last Year: Never true    Ran Out of Food in the Last Year: Never true  Transportation Needs: No Transportation Needs (05/19/2024)   Epic    Lack of Transportation (Medical): No    Lack of Transportation (Non-Medical): No  Physical Activity: Insufficiently Active (05/19/2024)   Exercise Vital Sign    Days of Exercise per Week: 1 day    Minutes  of Exercise per Session: 20 min  Stress: No Stress Concern Present (05/19/2024)   [employer] of Occupational Health - Occupational Stress Questionnaire    Feeling of Stress: Not at all  Social Connections: Moderately Isolated (05/19/2024)   Social Connection and Isolation Panel    Frequency of Communication with Friends and Family: More than three times a week    Frequency of Social Gatherings with Friends and Family: More than three times a week    Attends Religious Services: 1 to 4 times per year    Active Member of [employer] or Organizations: No    Attends Engineer, Structural: Not on file    Marital Status: Never married   Intimate Partner Violence: Not on file  Depression (PHQ2-9): Low Risk (11/14/2023)   Depression (PHQ2-9)    PHQ-2 Score: 0  Alcohol Screen: Not on file  Housing: Low Risk (05/19/2024)   Epic    Unable to Pay for Housing in the Last Year: No    Number of Times Moved in the Last Year: 0    Homeless in the Last Year: No  Utilities: Not on file  Health Literacy: Not on file    Family History  Problem Relation Age of Onset   Healthy Mother    Healthy Father    Asthma Brother    Diabetes Maternal Aunt    Cancer Maternal Aunt    Heart disease Neg Hx    Hypertension Neg Hx      Review of Systems  Constitutional: Negative.  Negative for chills and fever.  HENT: Negative.  Negative for congestion and sore throat.   Respiratory: Negative.  Negative for cough and shortness of breath.   Cardiovascular: Negative.  Negative for chest pain and palpitations.  Gastrointestinal:  Positive for abdominal pain and heartburn.  Genitourinary: Negative.  Negative for dysuria and hematuria.  Musculoskeletal: Negative.   Skin: Negative.  Negative for rash.  Neurological:  Negative for dizziness and headaches.  All other systems reviewed and are negative.   Vitals:   05/20/24 1608  BP: 124/82  Pulse: (!) 56  Temp: 97.9 F (36.6 C)  SpO2: 96%    Physical Exam Vitals reviewed.  Constitutional:      Appearance: She is well-developed.  HENT:     Head: Normocephalic.     Mouth/Throat:     Mouth: Mucous membranes are moist.     Pharynx: Oropharynx is clear.  Eyes:     Extraocular Movements: Extraocular movements intact.     Conjunctiva/sclera: Conjunctivae normal.     Pupils: Pupils are equal, round, and reactive to light.  Cardiovascular:     Rate and Rhythm: Normal rate and regular rhythm.     Pulses: Normal pulses.     Heart sounds: Normal heart sounds.  Pulmonary:     Effort: Pulmonary effort is normal.     Breath sounds: Normal breath sounds.  Abdominal:     Palpations:  Abdomen is soft.     Tenderness: There is no abdominal tenderness.  Musculoskeletal:     Cervical back: No tenderness.  Lymphadenopathy:     Cervical: No cervical adenopathy.  Skin:    General: Skin is warm and dry.     Capillary Refill: Capillary refill takes less than 2 seconds.  Neurological:     General: No focal deficit present.     Mental Status: She is alert and oriented to person, place, and time.  Psychiatric:  Mood and Affect: Mood normal.        Behavior: Behavior normal.      ASSESSMENT & PLAN: Problem List Items Addressed This Visit       Other   Epigastric pain - Primary   Clinically stable.  No red flag signs or symptoms. Benign abdominal examination. Diet and nutrition discussed Advised to stay well-hydrated Recommend trial of pantoprazole  40 mg daily for the next 3 to 4 weeks If symptoms persist recommend GI evaluation and possible upper endoscopy. ED precautions given Advised to contact the office if no better or worse during the next several days      Relevant Medications   pantoprazole  (PROTONIX ) 40 MG tablet   Other Relevant Orders   Lipase   Comprehensive metabolic panel with GFR   CBC with Differential/Platelet   Patient Instructions  Inflammation of the Stomach in Adults (Gastritis): What to Know Gastritis is when the lining of your stomach gets red and swollen (inflammation). There're two kinds of gastritis. There's gastritis that happens quickly. This is called acute gastritis. There's gastritis that happens over a long time. This is called chronic gastritis. Gastritis must be treated. If not, you can have sores or bleeding in your stomach. What are the causes? Germs that get to your stomach and cause an infection. Drinking too much alcohol. Taking some medicines. Too much acid in the stomach. Disease of the stomach. Allergies. Cancer treatments (radiation). Smoking cigarettes or using products that contain nicotine or  tobacco. What increases the risk? Having a disease of the intestines. Having an autoimmune disease, such as Crohn's disease. This is when your immune system attacks other organs in the body. Using aspirin, ibuprofen , and other NSAIDs to treat other conditions. Stress. What are the signs or symptoms? Pain or burning in your belly. Feeling like you may throw up. Throwing up. Feeling too full after you eat. Losing weight. Bad breath. Blood in your throw-up or poop. In some cases, there are no symptoms. How is this treated? Gastritis is treated with medicines. The medicines that are used depend on what caused the condition. If the condition was caused by germs (bacteria), you'll be given antibiotics. If the condition was caused by too much acid in the stomach, you'll be given antacids, H2 blockers, or proton pump inhibitors. You may also be told to stop taking certain medicines, such as aspirin, ibuprofen , and other NSAIDs. Follow these instructions at home: Medicines Take your medicines only as told. Take your antibiotics as told. Do not stop taking them even if you start to feel better. Alcohol use Do not drink alcohol if: Your doctor tells you not to drink. You are pregnant, may be pregnant, or are planning to become pregnant. If you drink alcohol: Limit how much you have to: 0-1 drink a day if you're female. 0-2 drinks a day if you're female. Know how much alcohol is in your drink. In the [place] one drink is one 12 oz bottle of beer (355 mL), one 5 oz glass of wine (148 mL), or one 1 oz glass of hard liquor (44 mL). General instructions Eat small meals often, instead of large meals. Avoid foods and drinks that make your symptoms worse. Drink more fluid as told. Do not smoke, vape, or use nicotine or tobacco. Talk with your doctor about ways to manage stress. You may be told to: Get regular exercise. Do deep breathing. Do meditation or yoga. Contact a doctor if: Your symptoms  get worse. The pain in  your belly gets worse. Your symptoms go away and then come back. You have a fever. Get help right away if: You throw up blood or a substance that looks like coffee grounds. You have black or dark red poop. You throw up every time you drink something. These symptoms may be an emergency. Call 911 right away. Do not wait to see if the symptoms will go away. Do not drive yourself to the hospital. This information is not intended to replace advice given to you by your health care provider. Make sure you discuss any questions you have with your health care provider. Document Revised: 06/25/2023 Document Reviewed: 06/25/2023 Elsevier Patient Education  2025 [employer]     Emil Schaumann, MD [facility]    [1] No Known Allergies

## 2024-05-21 ENCOUNTER — Ambulatory Visit: Payer: Self-pay | Admitting: Emergency Medicine
